# Patient Record
Sex: Female | Born: 1985 | Race: Black or African American | Hispanic: No | Marital: Single | Smoking: Never smoker
Health system: Southern US, Community
[De-identification: ages and names within clinical notes are randomized; demographics above are authoritative.]

## PROBLEM LIST (undated history)

## (undated) DIAGNOSIS — R569 Unspecified convulsions: Secondary | ICD-10-CM

## (undated) HISTORY — PX: SURGERY OF LIP: SUR1315

---
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
## 2009-02-13 ENCOUNTER — Emergency Department (HOSPITAL_COMMUNITY): Admission: EM | Admit: 2009-02-13 | Discharge: 2009-02-13 | Payer: Self-pay | Admitting: Emergency Medicine

## 2009-11-12 ENCOUNTER — Emergency Department (HOSPITAL_COMMUNITY): Admission: EM | Admit: 2009-11-12 | Discharge: 2009-11-12 | Payer: Self-pay | Admitting: Emergency Medicine

## 2009-11-14 ENCOUNTER — Emergency Department (HOSPITAL_COMMUNITY): Admission: EM | Admit: 2009-11-14 | Discharge: 2009-11-14 | Payer: Self-pay | Admitting: Emergency Medicine

## 2009-12-06 ENCOUNTER — Inpatient Hospital Stay (HOSPITAL_COMMUNITY)
Admission: AD | Admit: 2009-12-06 | Discharge: 2009-12-06 | Payer: Self-pay | Source: Home / Self Care | Admitting: Obstetrics & Gynecology

## 2010-01-03 ENCOUNTER — Emergency Department (HOSPITAL_COMMUNITY)
Admission: EM | Admit: 2010-01-03 | Discharge: 2010-01-03 | Payer: Self-pay | Source: Home / Self Care | Admitting: Emergency Medicine

## 2010-01-06 ENCOUNTER — Ambulatory Visit: Payer: Self-pay | Admitting: Obstetrics & Gynecology

## 2010-03-17 LAB — URINALYSIS, ROUTINE W REFLEX MICROSCOPIC
Bilirubin Urine: NEGATIVE
Glucose, UA: NEGATIVE mg/dL
Glucose, UA: NEGATIVE mg/dL
Ketones, ur: 15 mg/dL — AB
Ketones, ur: NEGATIVE mg/dL
Leukocytes, UA: NEGATIVE
Nitrite: NEGATIVE
Nitrite: NEGATIVE
Protein, ur: 100 mg/dL — AB
Protein, ur: NEGATIVE mg/dL
Specific Gravity, Urine: 1.026 (ref 1.005–1.030)
Specific Gravity, Urine: 1.02 (ref 1.005–1.030)
Urobilinogen, UA: 1 mg/dL (ref 0.0–1.0)
Urobilinogen, UA: 0.2 mg/dL (ref 0.0–1.0)
pH: 5.5 (ref 5.0–8.0)
pH: 6 (ref 5.0–8.0)

## 2010-03-17 LAB — URINE MICROSCOPIC-ADD ON

## 2010-03-17 LAB — DIFFERENTIAL
Basophils Absolute: 0 10*3/uL (ref 0.0–0.1)
Basophils Relative: 0 % (ref 0–1)
Eosinophils Absolute: 0 10*3/uL (ref 0.0–0.7)
Eosinophils Relative: 0 % (ref 0–5)
Lymphocytes Relative: 45 % (ref 12–46)
Lymphs Abs: 2.2 10*3/uL (ref 0.7–4.0)
Monocytes Absolute: 0.5 10*3/uL (ref 0.1–1.0)
Monocytes Relative: 11 % (ref 3–12)
Neutro Abs: 2.2 10*3/uL (ref 1.7–7.7)
Neutrophils Relative %: 44 % (ref 43–77)

## 2010-03-17 LAB — GC/CHLAMYDIA PROBE AMP, GENITAL
Chlamydia, DNA Probe: NEGATIVE
Chlamydia, DNA Probe: NEGATIVE
GC Probe Amp, Genital: NEGATIVE
GC Probe Amp, Genital: NEGATIVE

## 2010-03-17 LAB — PROLACTIN: Prolactin: 12.3 ng/mL

## 2010-03-17 LAB — BASIC METABOLIC PANEL
BUN: 7 mg/dL (ref 6–23)
CO2: 29 mEq/L (ref 19–32)
Calcium: 8.9 mg/dL (ref 8.4–10.5)
Chloride: 102 mEq/L (ref 96–112)
Creatinine, Ser: 0.75 mg/dL (ref 0.4–1.2)
GFR calc Af Amer: >60 mL/min (ref >60)
GFR calc non Af Amer: >60 mL/min (ref >60)
Glucose, Bld: 94 mg/dL (ref 70–99)
Potassium: 3 mEq/L — ABNORMAL LOW (ref 3.5–5.1)
Sodium: 136 mEq/L (ref 135–145)

## 2010-03-17 LAB — CBC
HCT: 27.2 % — ABNORMAL LOW (ref 36.0–46.0)
Hemoglobin: 8.1 g/dL — ABNORMAL LOW (ref 12.0–15.0)
MCH: 20.7 pg — ABNORMAL LOW (ref 26.0–34.0)
MCHC: 29.8 g/dL — ABNORMAL LOW (ref 30.0–36.0)
MCV: 69.4 fL — ABNORMAL LOW (ref 78.0–100.0)
Platelets: 322 10*3/uL (ref 150–400)
RBC: 3.92 MIL/uL (ref 3.87–5.11)
RDW: 19.7 % — ABNORMAL HIGH (ref 11.5–15.5)
WBC: 4.9 10*3/uL (ref 4.0–10.5)

## 2010-03-17 LAB — WET PREP, GENITAL
Trich, Wet Prep: NONE SEEN
Trich, Wet Prep: NONE SEEN
WBC, Wet Prep HPF POC: NONE SEEN
Yeast Wet Prep HPF POC: NONE SEEN
Yeast Wet Prep HPF POC: NONE SEEN

## 2010-03-17 LAB — TSH: TSH: 1.308 u[IU]/mL (ref 0.350–4.500)

## 2010-03-17 LAB — POCT PREGNANCY, URINE
Preg Test, Ur: NEGATIVE
Preg Test, Ur: NEGATIVE

## 2010-03-18 LAB — DIFFERENTIAL
Basophils Absolute: 0 10*3/uL (ref 0.0–0.1)
Basophils Relative: 0 % (ref 0–1)
Eosinophils Absolute: 0 10*3/uL (ref 0.0–0.7)
Eosinophils Relative: 0 % (ref 0–5)
Lymphocytes Relative: 18 % (ref 12–46)
Lymphs Abs: 1.2 10*3/uL (ref 0.7–4.0)
Monocytes Absolute: 0.5 10*3/uL (ref 0.1–1.0)
Monocytes Relative: 7 % (ref 3–12)
Neutro Abs: 5 10*3/uL (ref 1.7–7.7)
Neutrophils Relative %: 75 % (ref 43–77)

## 2010-03-18 LAB — CBC
HCT: 32.1 % — ABNORMAL LOW (ref 36.0–46.0)
Hemoglobin: 9.5 g/dL — ABNORMAL LOW (ref 12.0–15.0)
MCH: 20.3 pg — ABNORMAL LOW (ref 26.0–34.0)
MCHC: 29.6 g/dL — ABNORMAL LOW (ref 30.0–36.0)
MCV: 68.7 fL — ABNORMAL LOW (ref 78.0–100.0)
Platelets: 238 10*3/uL (ref 150–400)
RBC: 4.67 MIL/uL (ref 3.87–5.11)
RDW: 21.7 % — ABNORMAL HIGH (ref 11.5–15.5)
WBC: 6.7 10*3/uL (ref 4.0–10.5)

## 2010-03-18 LAB — COMPREHENSIVE METABOLIC PANEL
ALT: 21 U/L (ref 0–35)
AST: 27 U/L (ref 0–37)
Albumin: 3.6 g/dL (ref 3.5–5.2)
Alkaline Phosphatase: 62 U/L (ref 39–117)
BUN: 5 mg/dL — ABNORMAL LOW (ref 6–23)
CO2: 26 mEq/L (ref 19–32)
Calcium: 9.1 mg/dL (ref 8.4–10.5)
Chloride: 105 mEq/L (ref 96–112)
Creatinine, Ser: 0.71 mg/dL (ref 0.4–1.2)
GFR calc Af Amer: >60 mL/min (ref >60)
GFR calc non Af Amer: >60 mL/min (ref >60)
Glucose, Bld: 93 mg/dL (ref 70–99)
Potassium: 4.3 mEq/L (ref 3.5–5.1)
Sodium: 136 mEq/L (ref 135–145)
Total Bilirubin: 0.6 mg/dL (ref 0.3–1.2)
Total Protein: 8.2 g/dL (ref 6.0–8.3)

## 2010-03-18 LAB — URINALYSIS, ROUTINE W REFLEX MICROSCOPIC
Bilirubin Urine: NEGATIVE
Glucose, UA: NEGATIVE mg/dL
Hgb urine dipstick: NEGATIVE
Ketones, ur: NEGATIVE mg/dL
Nitrite: NEGATIVE
Protein, ur: NEGATIVE mg/dL
Specific Gravity, Urine: 1.017 (ref 1.005–1.030)
Urobilinogen, UA: 0.2 mg/dL (ref 0.0–1.0)
pH: 7.5 (ref 5.0–8.0)

## 2010-03-18 LAB — RAPID URINE DRUG SCREEN, HOSP PERFORMED
Amphetamines: NOT DETECTED
Barbiturates: NOT DETECTED
Benzodiazepines: NOT DETECTED
Cocaine: NOT DETECTED
Opiates: NOT DETECTED
Tetrahydrocannabinol: POSITIVE — AB

## 2010-03-18 LAB — PREGNANCY, URINE: Preg Test, Ur: NEGATIVE

## 2010-05-23 NOTE — Op Note (Signed)
NAMEWINDA, SUMMERALL              ACCOUNT NO.:  [id number]   MEDICAL RECORD NO.:  [id number]          PATIENT TYPE:  INP   LOCATION:  6121                         FACILITY:  [facility]   PHYSICIAN:  Prabhakar D. Pendse, M.D.DATE OF BIRTH:  1985-06-02   DATE OF PROCEDURE:  08/04/2004  DATE OF DISCHARGE:                                 OPERATIVE REPORT   PREOPERATIVE DIAGNOSIS:  Multiple gluteal abscesses.   POSTOPERATIVE DIAGNOSIS:  Multiple gluteal abscesses.   OPERATION PERFORMED AT:  1.  I&D of right gluteal abscess, 2 inches x 2 inches.  2.  I&D of left gluteal abscess, 1 inch x 1 inch.  3.  I&D of multiple microabscesses, total six of both buttocks.   SURGEON:  Prabhakar D. Levie Heritage, M.D.   ASSISTANT:  Nurse.   ANESTHESIA:  Nurse.   OPERATIVE PROCEDURE:  Under satisfactory general endotracheal anesthesia,  the patient in prone position, a large right gluteal abscess was opened by  sharp incision.  Abscess cavity entered. Purulent material obtained for  cultures.  Abscess cavity debrided, irrigated, packed with iodoform. Now the  left buttock abscess 1 inch x 1 inch was incised, drained, irrigated, packed  with Iodoform gauze. There were several tiny microabscesses and pustules,  which was opened by sharp incisions.  All the areas were irrigated and  dressed appropriately with occlusive dressing. Throughout the procedure, the  patient's vital signs remained stable. The patient withstood the procedure  well and was transferred to recovery room in satisfactory general condition.       PDP/MEDQ  D:  08/04/2004  T:  08/05/2004  Job:  [id number]   cc:   [facility]

## 2010-05-23 NOTE — Discharge Summary (Signed)
Nicole [name], ROSSA              ACCOUNT NO.:  [id number]   MEDICAL RECORD NO.:  [id number]          PATIENT TYPE:  INP   LOCATION:  6121                         FACILITY:  [facility]   PHYSICIAN:  Pediatrics Resident    DATE OF BIRTH:  27-Mar-1985   DATE OF ADMISSION:  08/04/2004  DATE OF DISCHARGE:  08/05/2004                                 DISCHARGE SUMMARY   HOSPITAL COURSE:  The patient is a 25 year old otherwise healthy female who  presents with multiple gluteal abscesses. Three weeks ago the patient had an  insect bite that was treated with five days of antibiotics, but did not  completely resolve. Three days ago the patient noted several pustules on  both buttocks and was referred to pediatric surgery. Patient underwent I&D  of two large gluteal abscesses and six micro abscesses on July 31st and was  started on IV clindamycin. The patient tolerated the procedure well and on  the day of discharge was tolerating p.o. food, was afebrile, and had good  urine output.   OPERATION/PROCEDURE:  1.  On August 04, 2004, I&D of multiple gluteal abscesses, two large and six      micro abscesses.  2.  On August 04, 2004, blood culture. No growth to date as of August 05, 2004.  3.  On August 04, 2004, wound culture significant for gram positive cocci and      clusters, sensitivity pending.   DIAGNOSIS:  Multiple gluteal abscesses.   MEDICATIONS:  Clindamycin 450 mg p.o. q.6h. times nine days.   DISCHARGE WEIGHT:  57 kg.   DISCHARGE CONDITION:  Stable.   DISCHARGE INSTRUCTIONS AND FOLLOW-UP:  Apply warm compress to area for 15  minutes, pat dry, apply Bactroban to entire second area, and cover with dry  dressing at least twice a day. Advised the patient to not remove packing,  only trim as necessary. Follow-up appointment scheduled with Dr. Levie Heritage on  Thursday, August 07, 2004, at 2 p.m.       PR/MEDQ  D:  08/05/2004  T:  08/06/2004  Job:  [id number]

## 2010-05-23 NOTE — Discharge Summary (Signed)
Nicole [name], BUFFIN              ACCOUNT NO.:  [id number]   MEDICAL RECORD NO.:  [id number]          PATIENT TYPE:  INP   LOCATION:  6121                         FACILITY:  [facility]   PHYSICIAN:  Pediatrics Resident    DATE OF BIRTH:  [birth year]   DATE OF ADMISSION:  08/04/2004  DATE OF DISCHARGE:  08/05/2004                                 DISCHARGE SUMMARY   HOSPITAL COURSE:  The patient is a 25 year old otherwise healthy female, who  presented with multiple gluteal abscesses.  Three weeks ago the patient had  an insect bite that was treated with five days of antibiotics but did not  completely resolve.  Three days ago the patient noticed several pustules on  both buttocks and was referred to pediatric surgery.  The patient underwent  I&D of two large gluteal abscesses and six microabscesses on July 31 and was  started on IV Clindamycin.  The patient tolerated the procedure well and on  the day of discharge was tolerating p.o., was afebrile, and had good urine  output.   OPERATIONS/PROCEDURES/TREATMENT:  1.  August 04, 2004:  I&D of multiple gluteal abscesses, two large and six      microabscesses.  2.  August 04, 2004:  Blood culture, no growth to date as of August 05, 2004.  3.  August 04, 2004:  Wound culture significant for gram positive cocci in      clusters.  Sensitivity is pending.   DIAGNOSIS:  Multiple gluteal abscesses.   MEDICATIONS:  Clindamycin 450 mg p.o. q.6 x9 days.   DISCHARGE WEIGHT:  57 kg.   CONDITION ON DISCHARGE:  Stable.   DISCHARGE INSTRUCTIONS/FOLLOW UP:  Apply warm compress to area for 15  minutes, pat dry.  Apply Bactroban to entire affected area and cover with  dry dressing at least twice a day.  Advised the patient not to remove  packing, only if necessary.  Follow-up appointment with Dr. Levie Heritage on  Thursday, August 07, 2004, at 2 p.m.       PR/MEDQ  D:  08/05/2004  T:  08/05/2004  Job:  [id number]

## 2010-06-16 ENCOUNTER — Inpatient Hospital Stay (HOSPITAL_COMMUNITY): Payer: Self-pay

## 2010-06-16 ENCOUNTER — Inpatient Hospital Stay (HOSPITAL_COMMUNITY)
Admission: AD | Admit: 2010-06-16 | Discharge: 2010-06-16 | Disposition: A | Discharge status: Home / Self Care | Payer: Self-pay | Source: Ambulatory Visit | Attending: Obstetrics & Gynecology | Admitting: Obstetrics & Gynecology

## 2010-06-16 DIAGNOSIS — O209 Hemorrhage in early pregnancy, unspecified: Secondary | ICD-10-CM

## 2010-06-16 LAB — URINALYSIS, ROUTINE W REFLEX MICROSCOPIC
Bilirubin Urine: NEGATIVE
Glucose, UA: NEGATIVE mg/dL
Ketones, ur: 40 mg/dL — AB
Protein, ur: NEGATIVE mg/dL

## 2010-06-16 LAB — WET PREP, GENITAL: Yeast Wet Prep HPF POC: NONE SEEN

## 2010-06-16 LAB — HCG, QUANTITATIVE, PREGNANCY: hCG, Beta Chain, Quant, S: 1693 m[IU]/mL — ABNORMAL HIGH (ref <5)

## 2010-06-16 LAB — CBC
HCT: 27.5 % — ABNORMAL LOW (ref 36.0–46.0)
MCHC: 28 g/dL — ABNORMAL LOW (ref 30.0–36.0)
RDW: 21.7 % — ABNORMAL HIGH (ref 11.5–15.5)

## 2010-06-16 LAB — URINE MICROSCOPIC-ADD ON

## 2010-06-16 LAB — ABO/RH: ABO/RH(D): AB POS

## 2010-06-19 ENCOUNTER — Inpatient Hospital Stay (HOSPITAL_COMMUNITY): Payer: Self-pay

## 2010-06-19 ENCOUNTER — Inpatient Hospital Stay (HOSPITAL_COMMUNITY)
Admission: AD | Admit: 2010-06-19 | Discharge: 2010-06-19 | Disposition: A | Discharge status: Home / Self Care | Payer: Self-pay | Source: Ambulatory Visit | Attending: Family Medicine | Admitting: Family Medicine

## 2010-06-19 DIAGNOSIS — O209 Hemorrhage in early pregnancy, unspecified: Secondary | ICD-10-CM | POA: Insufficient documentation

## 2010-06-21 ENCOUNTER — Ambulatory Visit (HOSPITAL_COMMUNITY): Payer: Self-pay

## 2011-06-18 ENCOUNTER — Encounter (HOSPITAL_COMMUNITY): Payer: Self-pay | Admitting: *Deleted

## 2011-06-18 ENCOUNTER — Emergency Department (HOSPITAL_COMMUNITY)
Admission: EM | Admit: 2011-06-18 | Discharge: 2011-06-18 | Disposition: A | Discharge status: Home / Self Care | Payer: Self-pay | Attending: Emergency Medicine | Admitting: Emergency Medicine

## 2011-06-18 DIAGNOSIS — R404 Transient alteration of awareness: Secondary | ICD-10-CM | POA: Insufficient documentation

## 2011-06-18 DIAGNOSIS — Z79899 Other long term (current) drug therapy: Secondary | ICD-10-CM | POA: Insufficient documentation

## 2011-06-18 DIAGNOSIS — G40909 Epilepsy, unspecified, not intractable, without status epilepticus: Secondary | ICD-10-CM | POA: Insufficient documentation

## 2011-06-18 HISTORY — DX: Unspecified convulsions: R56.9

## 2011-06-18 LAB — BASIC METABOLIC PANEL
BUN: 8 mg/dL (ref 6–23)
CO2: 22 mEq/L (ref 19–32)
Calcium: 9.1 mg/dL (ref 8.4–10.5)
Creatinine, Ser: 0.68 mg/dL (ref 0.50–1.10)
Glucose, Bld: 93 mg/dL (ref 70–99)

## 2011-06-18 LAB — DIFFERENTIAL
Basophils Absolute: 0 10*3/uL (ref 0.0–0.1)
Eosinophils Relative: 0 % (ref 0–5)
Lymphocytes Relative: 10 % — ABNORMAL LOW (ref 12–46)
Monocytes Absolute: 0.4 10*3/uL (ref 0.1–1.0)
Monocytes Relative: 5 % (ref 3–12)

## 2011-06-18 LAB — URINE MICROSCOPIC-ADD ON

## 2011-06-18 LAB — URINALYSIS, ROUTINE W REFLEX MICROSCOPIC
Bilirubin Urine: NEGATIVE
Glucose, UA: NEGATIVE mg/dL
Protein, ur: NEGATIVE mg/dL

## 2011-06-18 LAB — CBC
HCT: 34 % — ABNORMAL LOW (ref 36.0–46.0)
MCV: 72.8 fL — ABNORMAL LOW (ref 78.0–100.0)
RDW: 20.1 % — ABNORMAL HIGH (ref 11.5–15.5)
WBC: 8.7 10*3/uL (ref 4.0–10.5)

## 2011-06-18 MED ORDER — LEVETIRACETAM 500 MG PO TABS: 500.0000 mg | ORAL_TABLET | Freq: Two times a day (BID) | ORAL | Status: DC

## 2011-06-18 MED ORDER — LEVETIRACETAM 500 MG PO TABS
1000.0000 mg | ORAL_TABLET | Freq: Once | ORAL | Status: AC
  Administered 2011-06-18: 1000 mg via ORAL
  Filled 2011-06-18: qty 2

## 2011-06-18 NOTE — ED Notes (Signed)
Pt given discharge instructions/explained, in no distress, escorted to discharge window. 

## 2011-06-18 NOTE — Discharge Instructions (Signed)
Please read and follow all provided instructions.  Your diagnoses today include:  1. Seizure disorder     Tests performed today include:  Blood and urine tests - normal  Vital signs. See below for your results today.   Medications prescribed:   Keppra - anti-seizure medication  Home care instructions:  Follow any educational materials contained in this packet.  Follow-up instructions: Please follow-up with the neurologist listed. Tell them you were in the Emergency Department and told to follow-up.   If you do not have a primary care doctor -- see below for referral information.   Return instructions:   Please return to the Emergency Department if you experience worsening symptoms.   Please return if you have any other emergent concerns.  Additional Information:  Your vital signs today were: BP 105/56  Pulse 92  Temp 97.9 F (36.6 C) (Oral)  Resp 20  SpO2 100%  LMP 06/18/2011 If your blood pressure (BP) was elevated above 135/85 this visit, please have this repeated by your doctor within one month. -------------- No Primary Care Doctor Call [facility]  [phone] Other agencies that provide inexpensive medical care    [facility]  [phone]    [facility]  [phone]    [facility]  [phone]    [facility]  [phone]    [facility]  [phone]    [facility]  [phone] -------------- RESOURCE GUIDE:  Dental Problems  Patients with Medicaid: [facility] [phone] [address]                                            [phone] [address] Phone:  [phone]                                                   Phone:  [phone]  If unable to pay or uninsured, contact:  [facility] or [facility]. to become qualified for the adult dental clinic.  Chronic Pain Problems Contact [facility]  [phone] Patients need to be  referred by their primary care doctor.  Insufficient Money for Medicine Contact [employer]:  call "211" or [facility] [phone].  Psychological Services [facility]  [phone] [facility]  [phone] [facility] Mental Health   [phone] (emergency services [phone])  Substance Abuse Resources [facility]  [phone] [facility] [phone] [facility] [place] [phone] Floydene Flock [phone] Residential & Outpatient Substance Abuse Program  [phone]  Abuse/Neglect [facility] Child Abuse Hotline 410-745-1631 [facility] Child Abuse Hotline 714-712-1205 (After Hours)  Emergency Shelter [facility] [phone]  Maternity Homes [facility] [phone] [place] [facility] [phone]  [facility] Resources  [facility]     [employer]                          [facility] [address] [place]  [address]      [address]  Blondell Reveal Phone:  [phone]                                   Phone:  [phone]                 Phone:  [phone]  [facility] Phone:  [phone]  [facility] Child Abuse Hotline [phone] [phone] (After Hours)

## 2011-06-18 NOTE — ED Provider Notes (Signed)
History     CSN: [id number]  Arrival date & time 06/18/11  4098   First MD Initiated Contact with Patient 06/18/11 0744      Chief Complaint  Patient presents with  . Seizures    (Consider location/radiation/quality/duration/timing/severity/associated sxs/prior treatment) HPI Comments: Patient with history of 2 seizures within the last year-- presents with 3 seizures this morning at 4:15am with each seizure lasting around 2-3 minutes. History is obtained from patient's boyfriend who has witnessed all of the patient's seizures. Boyfriend reports that the patient "thrashes around" during each seizure until he rolls her onto her side. He also reports that she "tenses up" at the end of each seizure. Patient did bite her lip during one seizure this morning. Boyfriend reports that the patient is very confused after her seizures, and patient has no memory of the seizures occuring. Patient was brought in by EMS for her first seizure a year ago, and was referred to a neurologist, however due to costs did not follow up with neurology. Patient is not taking seizure medication, and denies recent drug or alcohol use. No fever, N/V/D.   Patient is a 26 y.o. female presenting with seizures. The history is provided by the patient.  Seizures  This is a new problem. The current episode started 3 to 5 hours ago. The problem has been resolved. There were 2 to 3 seizures. The most recent episode lasted 2 to 5 minutes. Pertinent negatives include no headaches, no visual disturbance, no neck stiffness, no sore throat, no chest pain, no cough, no nausea, no vomiting and no diarrhea. Characteristics include rhythmic jerking, loss of consciousness and bit tongue. Characteristics do not include bowel incontinence or bladder incontinence. The episode was witnessed. The seizures did not continue in the ED. There has been no fever.    Past Medical History  Diagnosis Date  . Seizures     Past Surgical History    Procedure Date  . Surgery of lip     History reviewed. No pertinent family history.  History  Substance Use Topics  . Smoking status: Never Smoker   . Smokeless tobacco: Never Used  . Alcohol Use: Yes    OB History    Grav Para Term Preterm Abortions TAB SAB Ect Mult Living                  Review of Systems  Constitutional: Negative for fever.  HENT: Negative for sore throat and rhinorrhea.   Eyes: Negative for redness and visual disturbance.  Respiratory: Negative for cough.   Cardiovascular: Negative for chest pain.  Gastrointestinal: Negative for nausea, vomiting, abdominal pain, diarrhea and bowel incontinence.  Genitourinary: Negative for bladder incontinence and dysuria.  Musculoskeletal: Negative for myalgias.  Skin: Negative for rash.  Neurological: Positive for seizures and loss of consciousness. Negative for headaches.    Allergies  Review of patient's allergies indicates no known allergies.  Home Medications  No current outpatient prescriptions on file.  BP 105/56  Pulse 92  Temp 97.9 F (36.6 C) (Oral)  Resp 20  SpO2 100%  LMP 06/18/2011  Physical Exam  Nursing note and vitals reviewed. Constitutional: She is oriented to person, place, and time. She appears well-developed and well-nourished.  HENT:  Head: Normocephalic and atraumatic.  Eyes: Conjunctivae are normal. Right eye exhibits no discharge. Left eye exhibits no discharge.  Neck: Normal range of motion. Neck supple.  Cardiovascular: Normal rate, regular rhythm and normal heart sounds.   Pulmonary/Chest: Effort normal and  breath sounds normal.  Abdominal: Soft. There is no tenderness.  Neurological: She is alert and oriented to person, place, and time. She has normal strength. No cranial nerve deficit or sensory deficit. She displays a negative Romberg sign. Coordination normal. GCS eye subscore is 4. GCS verbal subscore is 5. GCS motor subscore is 6.  Skin: Skin is warm and dry.   Psychiatric: She has a normal mood and affect.    ED Course  Procedures (including critical care time)  Labs Reviewed  CBC - Abnormal; Notable for the following:    Hemoglobin 10.4 (*)     HCT 34.0 (*)     MCV 72.8 (*)     MCH 22.3 (*)     RDW 20.1 (*)     All other components within normal limits  DIFFERENTIAL - Abnormal; Notable for the following:    Neutrophils Relative 85 (*)     Lymphocytes Relative 10 (*)     All other components within normal limits  URINALYSIS, ROUTINE W REFLEX MICROSCOPIC - Abnormal; Notable for the following:    APPearance TURBID (*)     Hgb urine dipstick LARGE (*)     Ketones, ur TRACE (*)     All other components within normal limits  URINE MICROSCOPIC-ADD ON - Abnormal; Notable for the following:    Crystals URIC ACID CRYSTALS (*)     All other components within normal limits  BASIC METABOLIC PANEL  POCT PREGNANCY, URINE   No results found.   1. Seizure disorder     9:09 AM Patient seen and examined. Work-up initiated.    Vital signs reviewed and are as follows: Filed Vitals:   06/18/11 0744  BP: 105/56  Pulse: 92  Temp: 97.9 F (36.6 C)  Resp: 20    Date: 06/18/2011  Rate: 93  Rhythm: normal sinus rhythm  QRS Axis: normal  Intervals: normal  ST/T Wave abnormalities: normal  Conduction Disutrbances:none  Narrative Interpretation:   Old EKG Reviewed: no changes from 11/14/09  Pt informed of lab results. I spoke with Dr. Roseanne Reno of neurology -- recommends starting Keppra 500mg  bid and neurology follow-up. Patient and family informed of plan.  They agree and are comfortable with plan. Exam is unchanged at time of discharge.   Patient urged to return if she has another seizure.   MDM  Patient with history of seizures, no neurology followup. CT scan done during initial presentation. Keppra started in the emergency department. Patient given neurology followup. She is to continue Keppra and followup. No head injury. No  neurological changes. No concern for alcohol or benzodiazepine withdrawal.     Renne Crigler, PA 06/18/11 1604

## 2011-06-18 NOTE — ED Notes (Signed)
Pt's boyfriend woke up this morning with pt having a seizure around 4:15, pt was drooling, sweaty, teeth clenched and shaking. Pt had 2 seizures back to back, lasting a couple minutes per boyfriend. After seizures pt walked to bathroom then went back to sleep, then about an hour later she had another seizure. Pt first started having seizures about a year ago, then had one a month ago, then the one's this morning. Pt is not on seizure medication.

## 2011-06-18 NOTE — ED Provider Notes (Signed)
Medical screening examination/treatment/procedure(s) were performed by non-physician practitioner and as supervising physician I was immediately available for consultation/collaboration.   Dayton Bailiff, MD 06/18/11 [phone]

## 2011-06-18 NOTE — ED Notes (Signed)
Pt. Is aware that we need a urine specimen, but is unable to go at this time. 

## 2011-09-17 ENCOUNTER — Emergency Department (HOSPITAL_COMMUNITY)
Admission: EM | Admit: 2011-09-17 | Discharge: 2011-09-18 | Disposition: A | Discharge status: Home / Self Care | Payer: Self-pay | Attending: Emergency Medicine | Admitting: Emergency Medicine

## 2011-09-17 ENCOUNTER — Encounter (HOSPITAL_COMMUNITY): Payer: Self-pay | Admitting: *Deleted

## 2011-09-17 DIAGNOSIS — R569 Unspecified convulsions: Secondary | ICD-10-CM | POA: Insufficient documentation

## 2011-09-17 LAB — GLUCOSE, CAPILLARY: Glucose-Capillary: 124 mg/dL — ABNORMAL HIGH (ref 70–99)

## 2011-09-17 MED ORDER — ONDANSETRON HCL 4 MG/2ML IJ SOLN
INTRAMUSCULAR | Status: AC
  Administered 2011-09-17: 23:00:00
  Filled 2011-09-17: qty 2

## 2011-09-17 NOTE — ED Notes (Signed)
ZOX:[id number]<AV> Expected date:09/17/11<BR> Expected time:10:35 PM<BR> Means of arrival:Ambulance<BR> Comments:<BR> seizure

## 2011-09-17 NOTE — ED Notes (Signed)
Per EMS report: Pt was a work at [employer] when pt experienced as what witnesses described as a "grand-mal seizure" lasting approximately in 45 seconds in duration. Upon EMS arrival, pt was in what appeared as an post-ictal state.  Pt able to open eyes w/ verbal stimulation but had no understanding of surroundings or event. EMS reports that Pt became somewhat combative. Pt began vomiting and administered 4mg  Zofran with noted improvement to wretching. IV: 20G R Hand, BP 100/50, HR:120, RR: 18, CBG: 141.  Pt has a hx of seizure and was prescribed depakote but is not compliant with medications.

## 2011-09-18 LAB — URINALYSIS, ROUTINE W REFLEX MICROSCOPIC
Bilirubin Urine: NEGATIVE
Glucose, UA: NEGATIVE mg/dL
Hgb urine dipstick: NEGATIVE
Ketones, ur: NEGATIVE mg/dL
Leukocytes, UA: NEGATIVE
Nitrite: NEGATIVE
Protein, ur: 30 mg/dL — AB
Specific Gravity, Urine: 1.02 (ref 1.005–1.030)
Urobilinogen, UA: 0.2 mg/dL (ref 0.0–1.0)
pH: 5.5 (ref 5.0–8.0)

## 2011-09-18 LAB — BASIC METABOLIC PANEL
BUN: 7 mg/dL (ref 6–23)
CO2: 25 mEq/L (ref 19–32)
Calcium: 9.2 mg/dL (ref 8.4–10.5)
Chloride: 100 mEq/L (ref 96–112)
Creatinine, Ser: 0.69 mg/dL (ref 0.50–1.10)
GFR calc Af Amer: >90 mL/min (ref >90)
GFR calc non Af Amer: >90 mL/min (ref >90)
Glucose, Bld: 112 mg/dL — ABNORMAL HIGH (ref 70–99)
Potassium: 3.7 mEq/L (ref 3.5–5.1)
Sodium: 133 mEq/L — ABNORMAL LOW (ref 135–145)

## 2011-09-18 LAB — CBC
HCT: 30.7 % — ABNORMAL LOW (ref 36.0–46.0)
Hemoglobin: 9.6 g/dL — ABNORMAL LOW (ref 12.0–15.0)
MCH: 22.5 pg — ABNORMAL LOW (ref 26.0–34.0)
MCHC: 31.3 g/dL (ref 30.0–36.0)
MCV: 71.9 fL — ABNORMAL LOW (ref 78.0–100.0)
Platelets: 262 10*3/uL (ref 150–400)
RBC: 4.27 MIL/uL (ref 3.87–5.11)
RDW: 20.8 % — ABNORMAL HIGH (ref 11.5–15.5)
WBC: 6.1 10*3/uL (ref 4.0–10.5)

## 2011-09-18 LAB — URINE MICROSCOPIC-ADD ON

## 2011-09-18 LAB — VALPROIC ACID LEVEL: Valproic Acid Lvl: <10 ug/mL — ABNORMAL LOW (ref 50.0–100.0)

## 2011-09-18 MED ORDER — LEVETIRACETAM 500 MG PO TABS
500.0000 mg | ORAL_TABLET | Freq: Once | ORAL | Status: AC
  Administered 2011-09-18: 500 mg via ORAL
  Filled 2011-09-18 (×2): qty 1

## 2011-09-18 MED ORDER — IBUPROFEN 200 MG PO TABS
400.0000 mg | ORAL_TABLET | Freq: Once | ORAL | Status: AC
  Administered 2011-09-18: 400 mg via ORAL
  Filled 2011-09-18: qty 2

## 2011-09-18 MED ORDER — LEVETIRACETAM 500 MG PO TABS: 500.0000 mg | ORAL_TABLET | Freq: Two times a day (BID) | ORAL | Status: DC

## 2011-09-18 NOTE — ED Provider Notes (Signed)
History    25yf with seizure. Happened while at work just pta. Generalized tonic clonic activity reports. Pt amnestic to events. Apparently post-ictal for EMS. Currently c/o HA and feeling tired. Hx of seizure. Previously on meds but not compliant. Pt not sure of what specifically. Felt fine earlier in day. No fever or chills. No trauma. Denies ingestion. No urinary complaints.  CSN: [id number]  Arrival date & time 09/17/11  2246   First MD Initiated Contact with Patient 09/18/11 0020      No chief complaint on file.   (Consider location/radiation/quality/duration/timing/severity/associated sxs/prior treatment) HPI  Past Medical History  Diagnosis Date  . Seizures     Past Surgical History  Procedure Date  . Surgery of lip   . Surgery of lip     History reviewed. No pertinent family history.  History  Substance Use Topics  . Smoking status: Never Smoker   . Smokeless tobacco: Never Used  . Alcohol Use: Yes    OB History    Grav Para Term Preterm Abortions TAB SAB Ect Mult Living                  Review of Systems   Review of symptoms negative unless otherwise noted in HPI.   Allergies  Review of patient's allergies indicates no known allergies.  Home Medications   Current Outpatient Rx  Name Route Sig Dispense Refill  . LEVETIRACETAM 500 MG PO TABS Oral Take 1 tablet (500 mg total) by mouth every 12 (twelve) hours. 60 tablet 0    BP 128/67  Pulse 84  Temp 97.6 F (36.4 C)  Resp 18  SpO2 100%  LMP 09/06/2011  Physical Exam  Nursing note and vitals reviewed. Constitutional: She is oriented to person, place, and time. She appears well-developed and well-nourished. No distress.  HENT:  Head: Normocephalic and atraumatic.  Eyes: Conjunctivae normal are normal. Right eye exhibits no discharge. Left eye exhibits no discharge.  Neck: Neck supple.  Cardiovascular: Normal rate, regular rhythm and normal heart sounds.  Exam reveals no gallop and no  friction rub.   No murmur heard. Pulmonary/Chest: Effort normal and breath sounds normal. No respiratory distress.  Abdominal: Soft. She exhibits no distension. There is no tenderness.  Musculoskeletal: She exhibits no edema and no tenderness.  Neurological: She is alert and oriented to person, place, and time. No cranial nerve deficit. She exhibits normal muscle tone. Coordination normal.       Good finger to nose b/l  Skin: Skin is warm and dry.  Psychiatric: She has a normal mood and affect. Her behavior is normal. Thought content normal.    ED Course  Procedures (including critical care time)  Labs Reviewed  VALPROIC ACID LEVEL - Abnormal; Notable for the following:    Valproic Acid Lvl <10.0 (*)     All other components within normal limits  GLUCOSE, CAPILLARY - Abnormal; Notable for the following:    Glucose-Capillary 124 (*)     All other components within normal limits  URINALYSIS, ROUTINE W REFLEX MICROSCOPIC - Abnormal; Notable for the following:    Protein, ur 30 (*)     All other components within normal limits  BASIC METABOLIC PANEL - Abnormal; Notable for the following:    Sodium 133 (*)     Glucose, Bld 112 (*)     All other components within normal limits  CBC - Abnormal; Notable for the following:    Hemoglobin 9.6 (*)     HCT  30.7 (*)     MCV 71.9 (*)     MCH 22.5 (*)     RDW 20.8 (*)     All other components within normal limits  URINE MICROSCOPIC-ADD ON - Abnormal; Notable for the following:    Bacteria, UA MANY (*)     All other components within normal limits  POCT PREGNANCY, URINE  LAB REPORT - SCANNED   No results found.   1. Seizure       MDM  26 year old female with seizure. Patient has a history of same. Patient is supposed to take antiepileptics, but she's not sure what exactly. Per review of prior records neurology recommended 500 mg of Keppra twice a day on last ER visit in June. This is again initiated in the ED. Script provided. Anemia  noted, but has been lower previously. Minimal hypoNa. Doubt reason for seizure. No clinical signs of infectious process.  Feel safe for DC. Seizure precautions discussed. Outpt fu.       Raeford Razor, MD 09/21/11 [phone]

## 2012-08-28 IMAGING — US US OB COMP LESS 14 WK
1 series · 14 of 28 positions shown · non-contrast
Comparison: none

[Series 1: us ob comp less 14 wks · 14 of 55 slices shown]
[im 3/55]
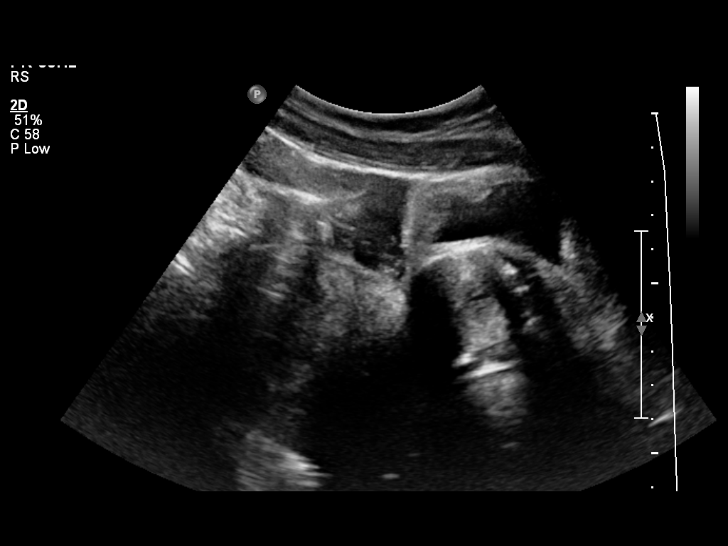
[im 7/55]
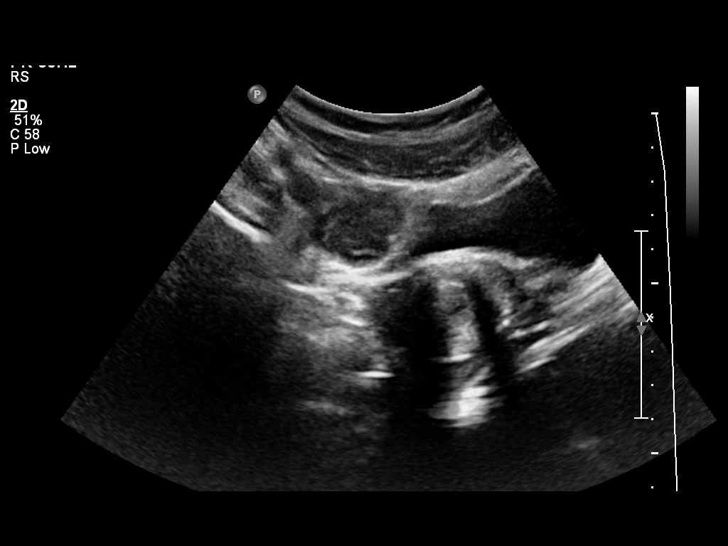
[im 11/55]
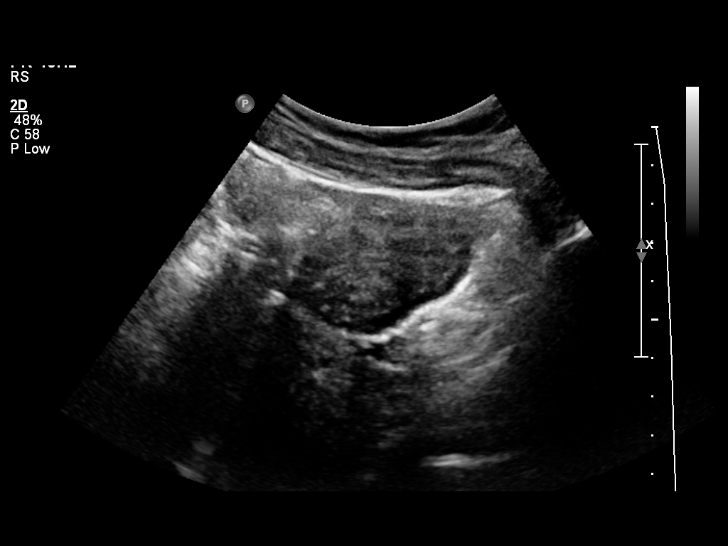
[im 15/55]
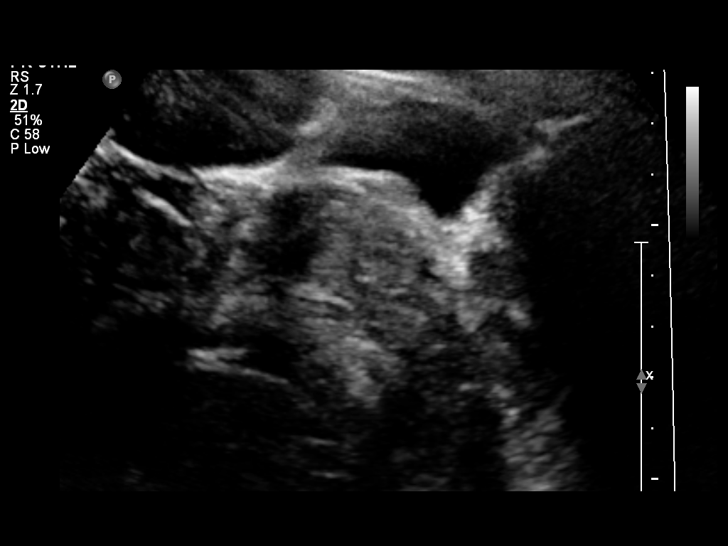
[im 19/55]
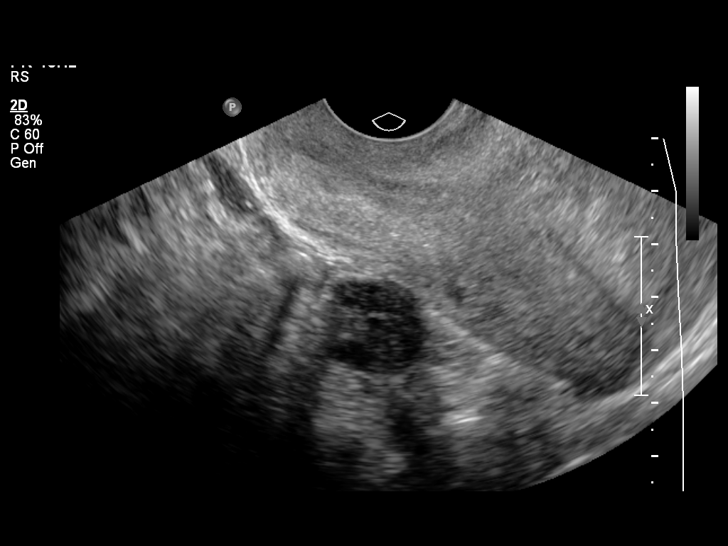
[im 23/55]
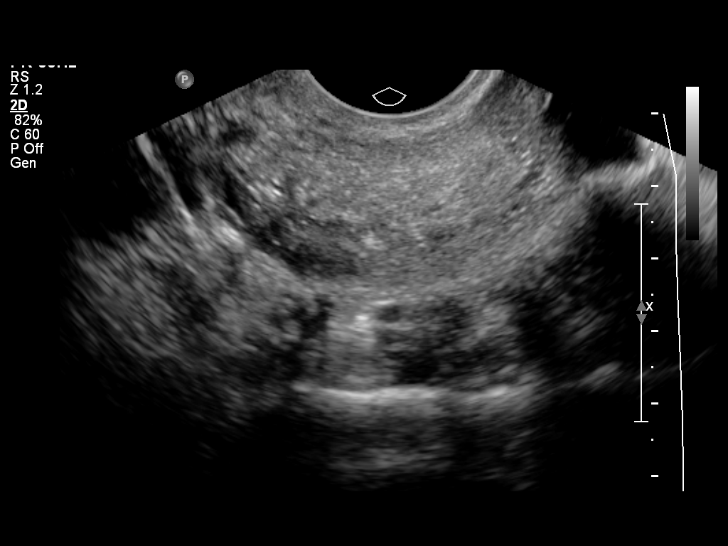
[im 27/55]
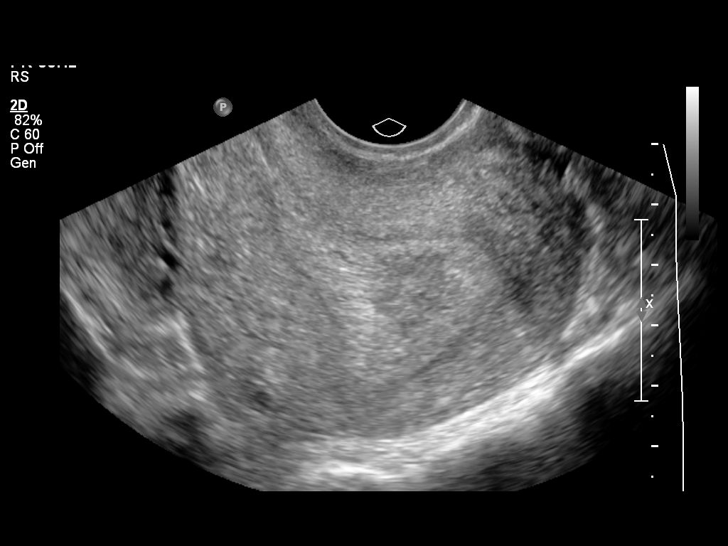
[im 31/55]
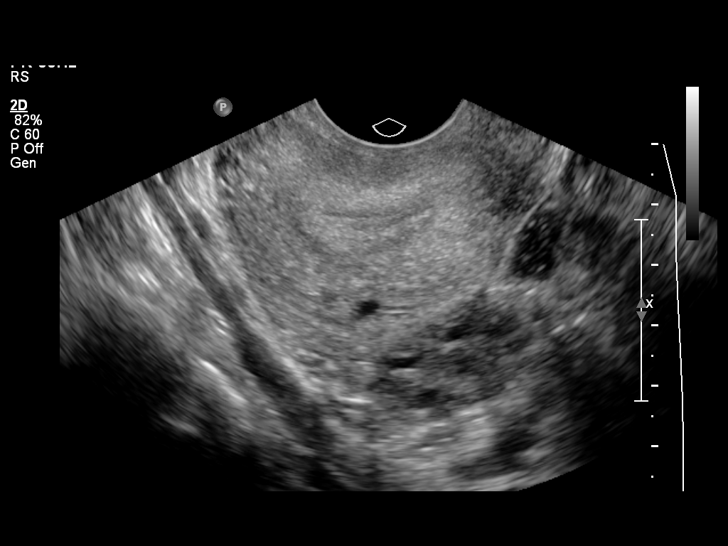
[im 35/55]
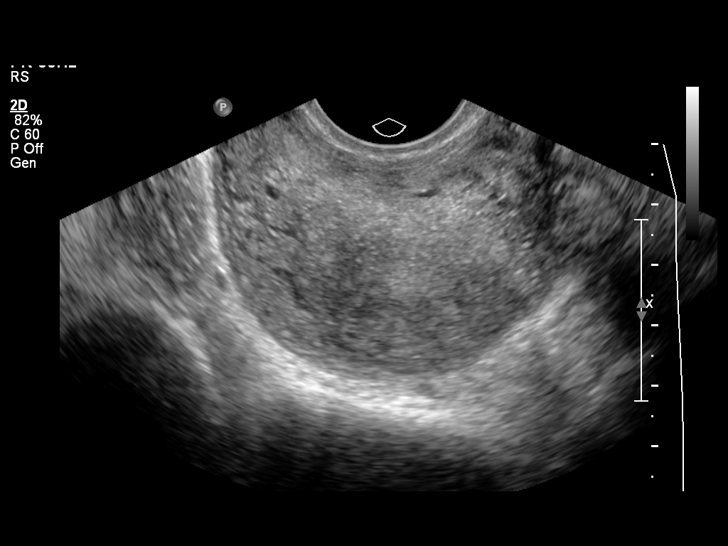
[im 39/55]
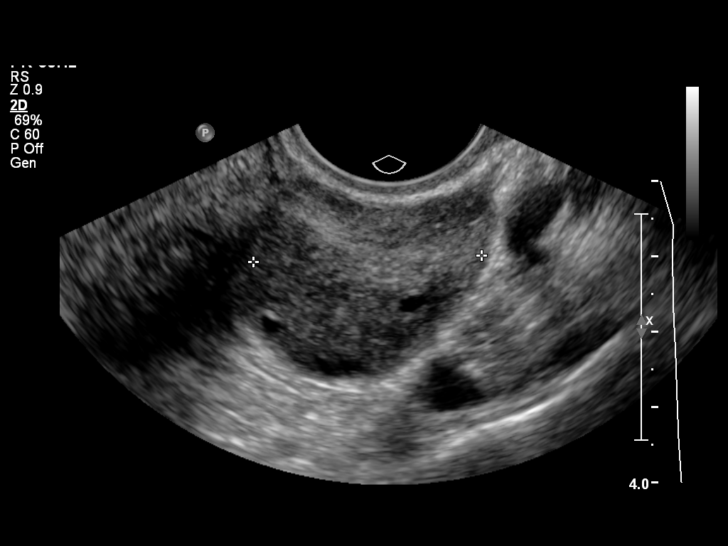
[im 43/55]
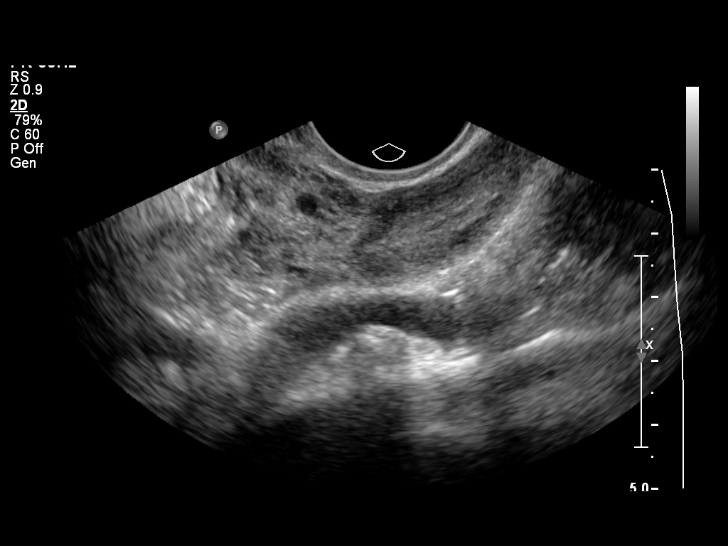
[im 47/55]
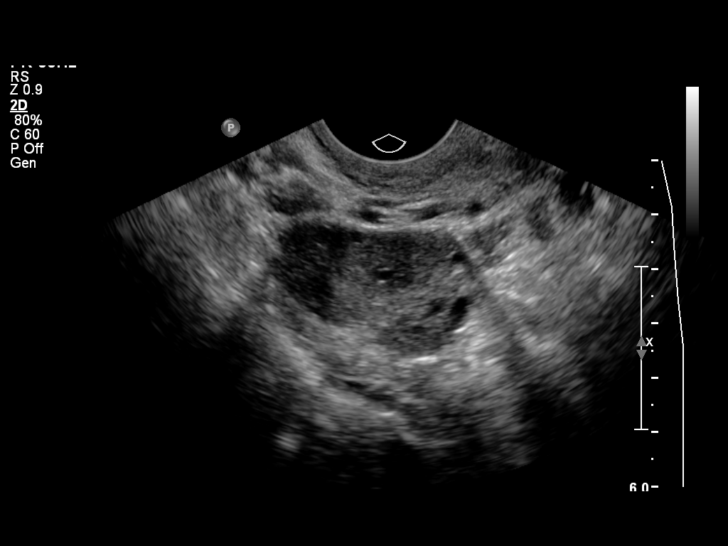
[im 51/55]
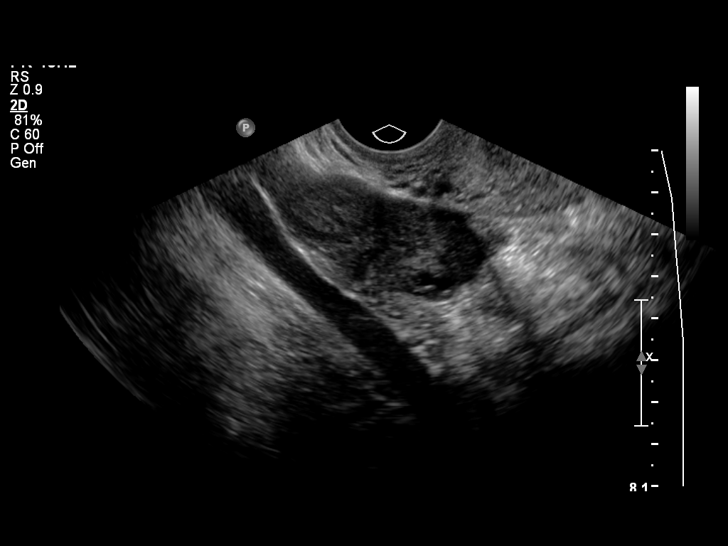
[im 55/55]
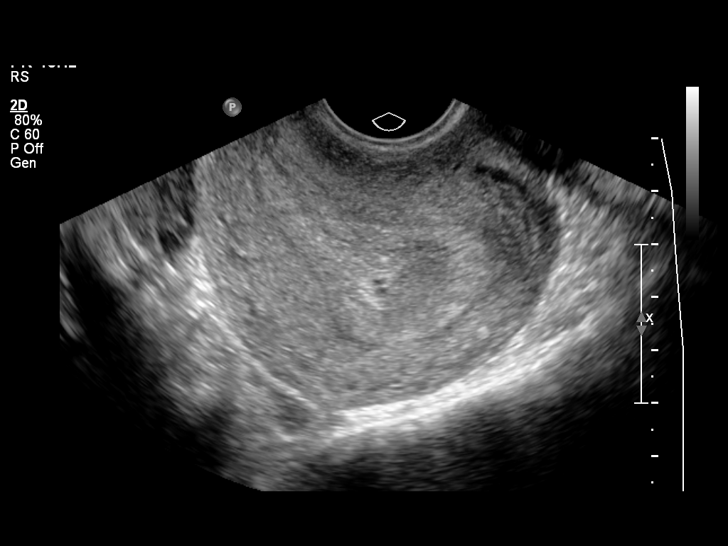

[14 of 28 positions shown; findings below may reference images not displayed]

OBSTETRICS REPORT
                      (Signed Final 06/16/2010 [DATE])

                 MAU/Triage
                 91_E
Procedures

 US OB COMP LESS 14 WKS                                76801.0
 US OB TRANSVAGINAL                                    76817.0
Indications

 Vaginal bleeding, unknown etiology
 Poor obstetric history-Recurrent (habitual) abortion
 (2 consecutive sab's)
Fetal Evaluation

 Gest. Sac:         ? early IUGS vs.
                    pseudosac
 Yolk Sac:          Not visualized
 Fetal Pole:        Not visualized
 Cardiac Activity:  No embryo visualized
Cervix Uterus Adnexa

 Cervix:       Closed.
 Uterus:       No abnormality visualized.
 Cul De Sac:   No free fluid seen.

 Left Ovary:    Within normal limits.
 Right Ovary:   Within normal limits.
 Adnexa:     No abnormality visualized.
Impression

 Suspect tiny 5wk IUGS barely visible transvaginally.
 No adnexal mass or free fluid identified.
Recommendations

 Close follow-up of quantitative B HCG levels, and followup by
 US in 10 days.

## 2012-09-12 LAB — OB RESULTS CONSOLE ABO/RH: RH Type: POSITIVE

## 2012-09-12 LAB — OB RESULTS CONSOLE HEPATITIS B SURFACE ANTIGEN: Hepatitis B Surface Ag: NEGATIVE

## 2012-09-12 LAB — OB RESULTS CONSOLE ANTIBODY SCREEN: ANTIBODY SCREEN: NEGATIVE

## 2012-09-12 LAB — OB RESULTS CONSOLE HIV ANTIBODY (ROUTINE TESTING): HIV: NONREACTIVE

## 2012-09-12 LAB — OB RESULTS CONSOLE RUBELLA ANTIBODY, IGM: RUBELLA: IMMUNE

## 2012-09-12 LAB — OB RESULTS CONSOLE RPR: RPR: NONREACTIVE

## 2012-09-12 LAB — OB RESULTS CONSOLE GC/CHLAMYDIA
Chlamydia: NEGATIVE
Gonorrhea: NEGATIVE

## 2013-01-17 LAB — OB RESULTS CONSOLE GBS: GBS: NEGATIVE

## 2013-01-22 LAB — OB RESULTS CONSOLE GBS: GBS: POSITIVE

## 2013-02-05 ENCOUNTER — Inpatient Hospital Stay (HOSPITAL_COMMUNITY): Payer: Medicaid Other | Admitting: Anesthesiology

## 2013-02-05 ENCOUNTER — Encounter (HOSPITAL_COMMUNITY): Payer: Medicaid Other | Admitting: Anesthesiology

## 2013-02-05 ENCOUNTER — Inpatient Hospital Stay (HOSPITAL_COMMUNITY)
Admission: AD | Admit: 2013-02-05 | Discharge: 2013-02-08 | DRG: 765 | Disposition: A | Discharge status: Home / Self Care | Payer: Medicaid Other | Source: Ambulatory Visit | Attending: Obstetrics and Gynecology | Admitting: Obstetrics and Gynecology

## 2013-02-05 ENCOUNTER — Encounter (HOSPITAL_COMMUNITY): Payer: Self-pay | Admitting: *Deleted

## 2013-02-05 DIAGNOSIS — E669 Obesity, unspecified: Secondary | ICD-10-CM | POA: Diagnosis present

## 2013-02-05 DIAGNOSIS — R569 Unspecified convulsions: Secondary | ICD-10-CM

## 2013-02-05 DIAGNOSIS — O99820 Streptococcus B carrier state complicating pregnancy: Secondary | ICD-10-CM

## 2013-02-05 DIAGNOSIS — O99354 Diseases of the nervous system complicating childbirth: Secondary | ICD-10-CM | POA: Diagnosis present

## 2013-02-05 DIAGNOSIS — O0930 Supervision of pregnancy with insufficient antenatal care, unspecified trimester: Secondary | ICD-10-CM

## 2013-02-05 DIAGNOSIS — O358XX0 Maternal care for other (suspected) fetal abnormality and damage, not applicable or unspecified: Secondary | ICD-10-CM | POA: Diagnosis present

## 2013-02-05 DIAGNOSIS — Z22330 Carrier of Group B streptococcus: Secondary | ICD-10-CM

## 2013-02-05 DIAGNOSIS — G40909 Epilepsy, unspecified, not intractable, without status epilepticus: Secondary | ICD-10-CM | POA: Diagnosis present

## 2013-02-05 DIAGNOSIS — O99214 Obesity complicating childbirth: Secondary | ICD-10-CM

## 2013-02-05 DIAGNOSIS — O9989 Other specified diseases and conditions complicating pregnancy, childbirth and the puerperium: Secondary | ICD-10-CM

## 2013-02-05 DIAGNOSIS — O99892 Other specified diseases and conditions complicating childbirth: Secondary | ICD-10-CM | POA: Diagnosis present

## 2013-02-05 DIAGNOSIS — IMO0002 Reserved for concepts with insufficient information to code with codable children: Secondary | ICD-10-CM

## 2013-02-05 DIAGNOSIS — Z8614 Personal history of Methicillin resistant Staphylococcus aureus infection: Secondary | ICD-10-CM

## 2013-02-05 DIAGNOSIS — Z22322 Carrier or suspected carrier of Methicillin resistant Staphylococcus aureus: Secondary | ICD-10-CM

## 2013-02-05 LAB — CBC
HEMATOCRIT: 37.5 % (ref 36.0–46.0)
Hemoglobin: 12.8 g/dL (ref 12.0–15.0)
MCH: 30.9 pg (ref 26.0–34.0)
MCHC: 34.1 g/dL (ref 30.0–36.0)
MCV: 90.6 fL (ref 78.0–100.0)
Platelets: 195 10*3/uL (ref 150–400)
RBC: 4.14 MIL/uL (ref 3.87–5.11)
RDW: 15.9 % — AB (ref 11.5–15.5)
WBC: 8.6 10*3/uL (ref 4.0–10.5)

## 2013-02-05 LAB — MRSA PCR SCREENING: MRSA BY PCR: NEGATIVE

## 2013-02-05 LAB — TYPE AND SCREEN
ABO/RH(D): AB POS
Antibody Screen: NEGATIVE

## 2013-02-05 LAB — AMNISURE RUPTURE OF MEMBRANE (ROM) NOT AT ARMC: Amnisure ROM: POSITIVE

## 2013-02-05 MED ORDER — EPHEDRINE 5 MG/ML INJ
10.0000 mg | INTRAVENOUS | Status: DC | PRN
  Filled 2013-02-05: qty 4

## 2013-02-05 MED ORDER — LACTATED RINGERS IV SOLN
INTRAVENOUS | Status: DC
  Administered 2013-02-05: 17:00:00 via INTRAVENOUS

## 2013-02-05 MED ORDER — FLEET ENEMA 7-19 GM/118ML RE ENEM: 1.0000 | ENEMA | RECTAL | Status: DC | PRN

## 2013-02-05 MED ORDER — LACTATED RINGERS IV SOLN
500.0000 mL | Freq: Once | INTRAVENOUS | Status: AC
  Administered 2013-02-05: 500 mL via INTRAVENOUS

## 2013-02-05 MED ORDER — BUTORPHANOL TARTRATE 1 MG/ML IJ SOLN
INTRAMUSCULAR | Status: AC
  Filled 2013-02-05: qty 1

## 2013-02-05 MED ORDER — LIDOCAINE HCL (PF) 1 % IJ SOLN
INTRAMUSCULAR | Status: DC | PRN
  Administered 2013-02-05 (×2): 4 mL

## 2013-02-05 MED ORDER — LACTATED RINGERS IV SOLN: INTRAVENOUS | Status: DC

## 2013-02-05 MED ORDER — FENTANYL 2.5 MCG/ML BUPIVACAINE 1/10 % EPIDURAL INFUSION (WH - ANES)
INTRAMUSCULAR | Status: DC | PRN
  Administered 2013-02-05: 14 mL/h via EPIDURAL

## 2013-02-05 MED ORDER — EPHEDRINE 5 MG/ML INJ: 10.0000 mg | INTRAVENOUS | Status: DC | PRN

## 2013-02-05 MED ORDER — CITRIC ACID-SODIUM CITRATE 334-500 MG/5ML PO SOLN
30.0000 mL | ORAL | Status: DC | PRN
  Administered 2013-02-06: 30 mL via ORAL
  Filled 2013-02-05: qty 15

## 2013-02-05 MED ORDER — DIPHENHYDRAMINE HCL 50 MG/ML IJ SOLN: 12.5000 mg | INTRAMUSCULAR | Status: DC | PRN

## 2013-02-05 MED ORDER — OXYTOCIN BOLUS FROM INFUSION: 500.0000 mL | INTRAVENOUS | Status: DC

## 2013-02-05 MED ORDER — PHENYLEPHRINE 40 MCG/ML (10ML) SYRINGE FOR IV PUSH (FOR BLOOD PRESSURE SUPPORT): 80.0000 ug | PREFILLED_SYRINGE | INTRAVENOUS | Status: DC | PRN

## 2013-02-05 MED ORDER — ACETAMINOPHEN 325 MG PO TABS: 650.0000 mg | ORAL_TABLET | ORAL | Status: DC | PRN

## 2013-02-05 MED ORDER — LIDOCAINE HCL (PF) 1 % IJ SOLN
30.0000 mL | INTRAMUSCULAR | Status: DC | PRN
  Filled 2013-02-05: qty 30

## 2013-02-05 MED ORDER — IBUPROFEN 600 MG PO TABS: 600.0000 mg | ORAL_TABLET | Freq: Four times a day (QID) | ORAL | Status: DC | PRN

## 2013-02-05 MED ORDER — ONDANSETRON HCL 4 MG/2ML IJ SOLN: 4.0000 mg | Freq: Four times a day (QID) | INTRAMUSCULAR | Status: DC | PRN

## 2013-02-05 MED ORDER — LACTATED RINGERS IV SOLN
500.0000 mL | INTRAVENOUS | Status: DC | PRN
  Administered 2013-02-05: 500 mL via INTRAVENOUS

## 2013-02-05 MED ORDER — OXYCODONE-ACETAMINOPHEN 5-325 MG PO TABS: 1.0000 | ORAL_TABLET | ORAL | Status: DC | PRN

## 2013-02-05 MED ORDER — PENICILLIN G POTASSIUM 5000000 UNITS IJ SOLR
5.0000 10*6.[IU] | Freq: Once | INTRAVENOUS | Status: AC
  Administered 2013-02-05: 5 10*6.[IU] via INTRAVENOUS
  Filled 2013-02-05: qty 5

## 2013-02-05 MED ORDER — FENTANYL 2.5 MCG/ML BUPIVACAINE 1/10 % EPIDURAL INFUSION (WH - ANES)
14.0000 mL/h | INTRAMUSCULAR | Status: DC | PRN
  Filled 2013-02-05: qty 125

## 2013-02-05 MED ORDER — PENICILLIN G POTASSIUM 5000000 UNITS IJ SOLR
2.5000 10*6.[IU] | INTRAVENOUS | Status: DC
  Administered 2013-02-05: 2.5 10*6.[IU] via INTRAVENOUS
  Filled 2013-02-05 (×6): qty 2.5

## 2013-02-05 MED ORDER — PHENYLEPHRINE 40 MCG/ML (10ML) SYRINGE FOR IV PUSH (FOR BLOOD PRESSURE SUPPORT)
80.0000 ug | PREFILLED_SYRINGE | INTRAVENOUS | Status: DC | PRN
  Filled 2013-02-05: qty 10

## 2013-02-05 MED ORDER — BUTORPHANOL TARTRATE 1 MG/ML IJ SOLN
1.0000 mg | INTRAMUSCULAR | Status: DC | PRN
  Administered 2013-02-05: 1 mg via INTRAVENOUS

## 2013-02-05 MED ORDER — OXYTOCIN 40 UNITS IN LACTATED RINGERS INFUSION - SIMPLE MED
62.5000 mL/h | INTRAVENOUS | Status: DC
  Filled 2013-02-05: qty 1000

## 2013-02-05 NOTE — H&P (Signed)
Nicole [name] is a 28 y.o. female, G2P0010 at 76 weeks, presenting for admission due to leaking of fluid last 2-3 hours, UCs q 5 min.  Amnisure + in MAU, cervix loose 1 cm, 100%, vtx, -1, forewaters palpated.  Patient Active Problem List   Diagnosis Date Noted  . Seizure--2013 x 1, no etiology 02/05/2013  . MRSA (methicillin resistant staph aureus) culture positive--per [facility] records 02/05/2013  . Fetal pyelectasis--bilateral 02/05/2013  . Fetal cardiac echogenic focus--LV, resolved during pregnancy 02/05/2013  . Late prenatal care 02/05/2013  . GBS (group B Streptococcus carrier), +RV culture, currently pregnant 02/05/2013    History of present pregnancy: Patient entered care at 17 5/7 weeks.   EDC of 02/19/13 was established by LMP and in agreement with [place] at 17 weeks..   Anatomy scan: 68 5/7weeks, with limited anatomy evaluation and an anterior placenta, female, cervix closed. Additional [place] evaluations:   19 3/7 weeks-- bilateral pyelectasis, LVEIF, completion of rest of anatomy WNL.  Cervix closed. 31 3/7 weeks--EFW 1850 gm, 52%ile, AFI 60%ile, left pyelectasis resolved, right increased to 0.70.  Significant prenatal events:  LVEIF and bilateral pyelectasis noted on anatomy scan, with normal Quad screen.  Declined flu vaccine and TDaP. LVEIF resolved on 27 week [place]. 33 2/7 weeks--Normal AFI, mild pyelectasis bilaterally, cervix closed. Last evaluation:  02/03/13, cervix 1 cm, 50%, vtx, -2.  OB History   Grav Para Term Preterm Abortions TAB SAB Ect Mult Living   2             2012--SAB, 5 weeks.  Past Medical History  Diagnosis Date  . Seizures     havnt had seizure in two years  MRSA hx noted on infection header of chart--patient reports remote hx of "staph infection" several years ago.   Past Surgical History  Procedure Laterality Date  . Surgery of lip    . Surgery of lip     Family History:MGM HTN, DM  Social History:  reports that she has never smoked. She has never  used smokeless tobacco. She reports that she drinks alcohol. She reports that she does not use illicit drugs.  FOB is present and involved.  Patient has some college, is currently unemployed.   Prenatal Transfer Tool  Maternal Diabetes: No Genetic Screening: Normal Quad screen Maternal Ultrasounds/Referrals: Abnormal:  Findings:   Fetal renal pyelectasis bilaterally (LVEIF resolved during pregnancy) Fetal Ultrasounds or other Referrals:  None Maternal Substance Abuse:  None during pregnancy Significant Maternal Medications:  None Significant Maternal Lab Results: Lab values include: Group B Strep positive, Other: MRSA Positive (remote hx)--retest pending from admission  ROS:  Contractions, leaking fluid, +FM  No Known Allergies   Dilation: 1 Effacement (%): 90 Station: -1 Exam by:: VEmilee Hero CNM Blood pressure 139/88, pulse 82, temperature 97.8 F (36.6 C), temperature source Oral, resp. rate 18, height 5\' 2"  (1.575 m), weight 216 lb 12.8 oz (98.34 kg).  Chest clear Heart RRR without murmur Abd gravid, NT, FH 39 cm Pelvic: Loose 1 cm, 1005, vtx, -1, forewaters noted, leaking small amount clear fluid Ext: WNL  FHR: Category 1 UCs:  q 5 min, moderate  Prenatal labs: ABO, Rh:  AB + Antibody:  Neg Rubella:   Immune RPR:   NR HBsAg:   Neg HIV:   NR GBS:  Positive Sickle cell/Hgb electrophoresis:  AA Pap:  WNL 09/16/12, with BV GC:  Negative 9/14 Chlamydia:  Negative 9/14 Genetic screenings:  Negative Quad screen Glucola:  Elevated 1 hour  GTT, single abnormal value on 3 hour GTT Hgb 13.3 at NOB, 12.9 at 28 weeks.   Assessment/Plan: IUP at 38 weeks SROM GBS positive MRSA hx Fetal pyelectasis and LVEIF Desires pain medication  Plan: Admit to Birthing Suite per consult with Dr. Stefano GaulStringer Routine [facility] orders GBS prophylaxis with PCN G per standard dosing MRSA precautions--will test on admission. Pain medication prn.  Akiko Schexnider, VICKICNM, [place] 02/05/2013, 5:03  PM

## 2013-02-05 NOTE — Anesthesia Preprocedure Evaluation (Addendum)
Anesthesia Evaluation  Patient identified by MRN, date of birth, ID band Patient awake    Reviewed: Allergy & Precautions, H&P , NPO status , Patient's Chart, lab work & pertinent test results  Airway Mallampati: II TM Distance: >3 FB Neck ROM: Full    Dental  (+) Dental Advisory Given   Pulmonary neg pulmonary ROS,  breath sounds clear to auscultation        Cardiovascular negative cardio ROS  Rhythm:Regular Rate:Normal     Neuro/Psych Seizures -,  negative psych ROS   GI/Hepatic negative GI ROS, Neg liver ROS,   Endo/Other  Morbid obesity  Renal/GU negative Renal ROS     Musculoskeletal negative musculoskeletal ROS (+)   Abdominal (+) + obese,   Peds  Hematology negative hematology ROS (+)   Anesthesia Other Findings   Reproductive/Obstetrics (+) Pregnancy                           Anesthesia Physical Anesthesia Plan  ASA: III  Anesthesia Plan: Epidural   Post-op Pain Management:    Induction:   Airway Management Planned:   Additional Equipment:   Intra-op Plan:   Post-operative Plan:   Informed Consent: I have reviewed the patients History and Physical, chart, labs and discussed the procedure including the risks, benefits and alternatives for the proposed anesthesia with the patient or authorized representative who has indicated his/her understanding and acceptance.   Dental advisory given  Plan Discussed with: CRNA  Anesthesia Plan Comments:        Anesthesia Quick Evaluation

## 2013-02-05 NOTE — Anesthesia Procedure Notes (Signed)
Epidural Patient location during procedure: OB Start time: 02/05/2013 7:58 PM End time: 02/05/2013 8:08 PM  Staffing Anesthesiologist: Lewie LoronGERMEROTH, Isiah Scheel R Performed by: anesthesiologist   Preanesthetic Checklist Completed: patient identified, pre-op evaluation, timeout performed, IV checked, risks and benefits discussed and monitors and equipment checked  Epidural Patient position: sitting Prep: site prepped and draped and DuraPrep Patient monitoring: heart rate Approach: midline Injection technique: LOR air and LOR saline  Needle:  Needle type: Tuohy  Needle gauge: 17 G Needle length: 9 cm Needle insertion depth: 7 cm Catheter type: closed end flexible Catheter size: 19 Gauge Catheter at skin depth: 13 cm Test dose: negative  Assessment Sensory level: T8 Events: blood not aspirated, injection not painful, no injection resistance, negative IV test and no paresthesia  Additional Notes Reason for block:procedure for pain

## 2013-02-05 NOTE — Progress Notes (Signed)
  Subjective: Stepped in to room to check on pt and SVE. After leaving the room FHT started a decel, [facility]  Objective: BP 122/81  Pulse 86  Temp(Src) 98.4 F (36.9 C) (Oral)  Resp 18  Ht 5\' 2"  (1.575 m)  Wt 216 lb 12.8 oz (98.34 kg)  BMI 39.64 kg/m2  SpO2 99%      FHT:  Cat II - position change, bolus UC:   regular, every 2-4 minutes  SVE:   Dilation: 6.5 Effacement (%): 100 Station: -1 Exam by:: J.Oaxley,   Assessment / Plan:  Spontaneous labor, progressing normally  Labor: Progressing normally  Preeclampsia: no signs or symptoms of toxicity  Fetal Wellbeing: Category II  Pain Control: Epidural  I/D: GBS neg; SROM of unknown timing; Afebrile    Anticipated MOD: NSVD   Nicole [name] 02/05/2013, 11:14 PM

## 2013-02-05 NOTE — Progress Notes (Signed)
  Subjective: Pt is tearful with intense UCs.  FOB, family and friends right by her side offering support. Pt requested Stadol at first when asked after SVE pt requested epidural.  Objective: BP 132/89  Pulse 83  Temp(Src) 98 F (36.7 C) (Oral)  Resp 20  Ht 5\' 2"  (1.575 m)  Wt 216 lb 12.8 oz (98.34 kg)  BMI 39.64 kg/m2      FHT:  Cat I UC:   regular, every 2-4 minutes  SVE:   Dilation: 3 Effacement (%): 80 Station: -2 Exam by:: J Oaxley CNM  Assessment / Plan:  Spontaneous labor, progressing normally  Labor: Progressing normally  Preeclampsia: no signs or symptoms of toxicity  Fetal Wellbeing: Category I  Pain Control: Epidural requested I/D: GBS neg; SROM arouund 1400 though time is unclear; Afebrile  Anticipated MOD: NSVD   Nicole [name] 02/05/2013, 7:30 PM

## 2013-02-05 NOTE — MAU Provider Note (Signed)
History   28 yo G1P0 at 38 weeks presented unannounced c/o ? Leaking today, with mucusy d/c, and contractions q 3-4 min at home, now less close together.  Cervix has been 1 cm in the office.  Patient Active Problem List   Diagnosis Date Noted  . Seizure--2013 x 1, no etiology 02/05/2013  . MRSA (methicillin resistant staph aureus) [facility] 02/05/[facility]  . Fetal pyelectasis--bilateral 02/05/2013  . Fetal cardiac echogenic focus--LV 02/05/2013  . Late prenatal care 02/05/2013  . GBS (group B Streptococcus carrier), +RV culture, currently pregnant 02/05/2013     Chief Complaint  Patient presents with  . Labor Eval   HPI  OB History   Grav Para Term Preterm Abortions TAB SAB Ect Mult Living   1               Past Medical History  Diagnosis Date  . Seizures     havnt had seizure in two years    Past Surgical History  Procedure Laterality Date  . Surgery of lip    . Surgery of lip      History reviewed. No pertinent family history.  History  Substance Use Topics  . Smoking status: Never Smoker   . Smokeless tobacco: Never Used  . Alcohol Use: Yes    Allergies: No Known Allergies  Prescriptions prior to admission  Medication Sig Dispense Refill  . Prenatal Vit-Fe Fumarate-FA (PRENATAL MULTIVITAMIN) TABS tablet Take 1 tablet by mouth daily at 12 noon.        ROS:  Contractions, +FM, leaking/discharge Physical Exam   Blood pressure 139/88, pulse 82, temperature 97.8 F (36.6 C), temperature source Oral, resp. rate 18, height 5\' 2"  (1.575 m), weight 216 lb 12.8 oz (98.34 kg).  Physical Exam Chest clear Heart RRR without murmur Abd gravid, NT Pelvic--anterior, loose 1 cm, 100%, vtx, -1, membranes palpated Ext WNL  FHR Category 1 UCs q 5 min, moderate.  ED Course  IUP at 38 weeks Early vs prodromal labor ? Leaking fluid  Plan: Amnisure Observe x 1-2 hours--patient declines ambulation.   Recheck cervix.   Nigel BridgemanLATHAM, Andreina Outten CNM,  [place] 02/05/2013 4:22 PM

## 2013-02-05 NOTE — MAU Note (Signed)
Patient states she is having contractions every 3-4 minutes. States she was 1 1/2 cm on Friday. States she has had a little discharge after contractions. Reports good fetal movement.

## 2013-02-06 ENCOUNTER — Encounter (HOSPITAL_COMMUNITY)
Admission: AD | Disposition: A | Discharge status: Home / Self Care | Payer: Self-pay | Source: Ambulatory Visit | Attending: Obstetrics and Gynecology

## 2013-02-06 ENCOUNTER — Encounter (HOSPITAL_COMMUNITY): Payer: Self-pay | Admitting: General Practice

## 2013-02-06 ENCOUNTER — Inpatient Hospital Stay (HOSPITAL_COMMUNITY): Payer: Medicaid Other

## 2013-02-06 HISTORY — PX: CYSTOSCOPY: SHX5120

## 2013-02-06 LAB — CBC
HEMATOCRIT: 32.9 % — AB (ref 36.0–46.0)
Hemoglobin: 11.1 g/dL — ABNORMAL LOW (ref 12.0–15.0)
MCH: 30.5 pg (ref 26.0–34.0)
MCHC: 33.7 g/dL (ref 30.0–36.0)
MCV: 90.4 fL (ref 78.0–100.0)
Platelets: 169 10*3/uL (ref 150–400)
RBC: 3.64 MIL/uL — ABNORMAL LOW (ref 3.87–5.11)
RDW: 16.1 % — AB (ref 11.5–15.5)
WBC: 12.2 10*3/uL — ABNORMAL HIGH (ref 4.0–10.5)

## 2013-02-06 LAB — RPR: RPR Ser Ql: NONREACTIVE

## 2013-02-06 SURGERY — Surgical Case
Anesthesia: Epidural

## 2013-02-06 SURGERY — Surgical Case
Anesthesia: Choice

## 2013-02-06 MED ORDER — KETOROLAC TROMETHAMINE 60 MG/2ML IM SOLN
INTRAMUSCULAR | Status: AC
  Administered 2013-02-06: 60 mg via INTRAMUSCULAR
  Filled 2013-02-06: qty 2

## 2013-02-06 MED ORDER — METHYLENE BLUE 1 % INJ SOLN
INTRAMUSCULAR | Status: DC | PRN
  Administered 2013-02-06: 50 mg via INTRAVENOUS

## 2013-02-06 MED ORDER — OXYTOCIN 40 UNITS IN LACTATED RINGERS INFUSION - SIMPLE MED: 62.5000 mL/h | INTRAVENOUS | Status: AC

## 2013-02-06 MED ORDER — ZOLPIDEM TARTRATE 5 MG PO TABS: 5.0000 mg | ORAL_TABLET | Freq: Every evening | ORAL | Status: DC | PRN

## 2013-02-06 MED ORDER — DIPHENHYDRAMINE HCL 50 MG/ML IJ SOLN: 12.5000 mg | INTRAMUSCULAR | Status: DC | PRN

## 2013-02-06 MED ORDER — OXYTOCIN 10 UNIT/ML IJ SOLN
INTRAMUSCULAR | Status: AC
  Filled 2013-02-06: qty 4

## 2013-02-06 MED ORDER — METOCLOPRAMIDE HCL 5 MG/ML IJ SOLN: 10.0000 mg | Freq: Three times a day (TID) | INTRAMUSCULAR | Status: DC | PRN

## 2013-02-06 MED ORDER — SCOPOLAMINE 1 MG/3DAYS TD PT72
MEDICATED_PATCH | TRANSDERMAL | Status: AC
  Administered 2013-02-06: 1.5 mg via TRANSDERMAL
  Filled 2013-02-06: qty 1

## 2013-02-06 MED ORDER — DIBUCAINE 1 % RE OINT: 1.0000 "application " | TOPICAL_OINTMENT | RECTAL | Status: DC | PRN

## 2013-02-06 MED ORDER — BUPIVACAINE-EPINEPHRINE (PF) 0.5% -1:200000 IJ SOLN
INTRAMUSCULAR | Status: AC
  Filled 2013-02-06: qty 10

## 2013-02-06 MED ORDER — CEFAZOLIN SODIUM 1-5 GM-% IV SOLN
1.0000 g | Freq: Three times a day (TID) | INTRAVENOUS | Status: DC
  Administered 2013-02-06 – 2013-02-07 (×4): 1 g via INTRAVENOUS
  Filled 2013-02-06 (×4): qty 50

## 2013-02-06 MED ORDER — OXYCODONE-ACETAMINOPHEN 5-325 MG PO TABS
1.0000 | ORAL_TABLET | ORAL | Status: DC | PRN
  Administered 2013-02-06 – 2013-02-07 (×3): 1 via ORAL
  Filled 2013-02-06 (×3): qty 1

## 2013-02-06 MED ORDER — LACTATED RINGERS IV SOLN
INTRAVENOUS | Status: DC | PRN
  Administered 2013-02-06: 02:00:00 via INTRAVENOUS

## 2013-02-06 MED ORDER — DIPHENHYDRAMINE HCL 25 MG PO CAPS: 25.0000 mg | ORAL_CAPSULE | Freq: Four times a day (QID) | ORAL | Status: DC | PRN

## 2013-02-06 MED ORDER — MEPERIDINE HCL 25 MG/ML IJ SOLN: 6.2500 mg | INTRAMUSCULAR | Status: DC | PRN

## 2013-02-06 MED ORDER — CEFAZOLIN SODIUM-DEXTROSE 2-3 GM-% IV SOLR
INTRAVENOUS | Status: DC | PRN
  Administered 2013-02-06: 2 g via INTRAVENOUS

## 2013-02-06 MED ORDER — SIMETHICONE 80 MG PO CHEW
80.0000 mg | CHEWABLE_TABLET | Freq: Three times a day (TID) | ORAL | Status: DC
  Administered 2013-02-06 – 2013-02-08 (×4): 80 mg via ORAL
  Filled 2013-02-06 (×5): qty 1

## 2013-02-06 MED ORDER — PROMETHAZINE HCL 25 MG/ML IJ SOLN: 6.2500 mg | INTRAMUSCULAR | Status: DC | PRN

## 2013-02-06 MED ORDER — HYDROMORPHONE HCL PF 1 MG/ML IJ SOLN
0.2500 mg | INTRAMUSCULAR | Status: DC | PRN
Start: 2013-02-06 — End: 2013-02-06
  Administered 2013-02-06 (×2): 0.5 mg via INTRAVENOUS

## 2013-02-06 MED ORDER — KETOROLAC TROMETHAMINE 30 MG/ML IJ SOLN: 30.0000 mg | Freq: Four times a day (QID) | INTRAMUSCULAR | Status: AC | PRN

## 2013-02-06 MED ORDER — NALBUPHINE HCL 10 MG/ML IJ SOLN: 5.0000 mg | INTRAMUSCULAR | Status: DC | PRN

## 2013-02-06 MED ORDER — ONDANSETRON HCL 4 MG PO TABS: 4.0000 mg | ORAL_TABLET | ORAL | Status: DC | PRN

## 2013-02-06 MED ORDER — TETANUS-DIPHTH-ACELL PERTUSSIS 5-2.5-18.5 LF-MCG/0.5 IM SUSP: 0.5000 mL | Freq: Once | INTRAMUSCULAR | Status: DC

## 2013-02-06 MED ORDER — MEPERIDINE HCL 25 MG/ML IJ SOLN
INTRAMUSCULAR | Status: DC | PRN
  Administered 2013-02-06 (×2): 12.5 mg via INTRAVENOUS

## 2013-02-06 MED ORDER — CEFAZOLIN SODIUM 1-5 GM-% IV SOLN
1.0000 g | Freq: Three times a day (TID) | INTRAVENOUS | Status: DC
  Filled 2013-02-06 (×2): qty 50

## 2013-02-06 MED ORDER — DIPHENHYDRAMINE HCL 25 MG PO CAPS: 25.0000 mg | ORAL_CAPSULE | ORAL | Status: DC | PRN

## 2013-02-06 MED ORDER — SENNOSIDES-DOCUSATE SODIUM 8.6-50 MG PO TABS
2.0000 | ORAL_TABLET | ORAL | Status: DC
  Administered 2013-02-07 (×2): 2 via ORAL
  Filled 2013-02-06 (×2): qty 2

## 2013-02-06 MED ORDER — SODIUM BICARBONATE 8.4 % IV SOLN
INTRAVENOUS | Status: AC
  Filled 2013-02-06: qty 50

## 2013-02-06 MED ORDER — MEASLES, MUMPS & RUBELLA VAC ~~LOC~~ INJ
0.5000 mL | INJECTION | Freq: Once | SUBCUTANEOUS | Status: DC
  Filled 2013-02-06: qty 0.5

## 2013-02-06 MED ORDER — CEFAZOLIN SODIUM-DEXTROSE 2-3 GM-% IV SOLR
INTRAVENOUS | Status: AC
  Filled 2013-02-06: qty 50

## 2013-02-06 MED ORDER — CEFAZOLIN SODIUM-DEXTROSE 2-3 GM-% IV SOLR: 2.0000 g | INTRAVENOUS | Status: DC

## 2013-02-06 MED ORDER — MORPHINE SULFATE (PF) 0.5 MG/ML IJ SOLN
INTRAMUSCULAR | Status: DC | PRN
  Administered 2013-02-06: 4 mg via EPIDURAL

## 2013-02-06 MED ORDER — PHENYLEPHRINE HCL 10 MG/ML IJ SOLN
INTRAMUSCULAR | Status: DC | PRN
  Administered 2013-02-06: 40 ug via INTRAVENOUS

## 2013-02-06 MED ORDER — MIDAZOLAM HCL 2 MG/2ML IJ SOLN
INTRAMUSCULAR | Status: AC
Start: 2013-02-06 — End: 2013-02-06
  Filled 2013-02-06: qty 2

## 2013-02-06 MED ORDER — DIPHENHYDRAMINE HCL 50 MG/ML IJ SOLN: 25.0000 mg | INTRAMUSCULAR | Status: DC | PRN

## 2013-02-06 MED ORDER — SIMETHICONE 80 MG PO CHEW
80.0000 mg | CHEWABLE_TABLET | ORAL | Status: DC
  Administered 2013-02-07 (×2): 80 mg via ORAL
  Filled 2013-02-06 (×2): qty 1

## 2013-02-06 MED ORDER — LIDOCAINE-EPINEPHRINE (PF) 2 %-1:200000 IJ SOLN
INTRAMUSCULAR | Status: AC
  Filled 2013-02-06: qty 20

## 2013-02-06 MED ORDER — SCOPOLAMINE 1 MG/3DAYS TD PT72
1.0000 | MEDICATED_PATCH | Freq: Once | TRANSDERMAL | Status: DC
  Administered 2013-02-06: 1.5 mg via TRANSDERMAL

## 2013-02-06 MED ORDER — KETOROLAC TROMETHAMINE 60 MG/2ML IM SOLN
60.0000 mg | Freq: Once | INTRAMUSCULAR | Status: AC | PRN
  Administered 2013-02-06: 60 mg via INTRAMUSCULAR

## 2013-02-06 MED ORDER — MIDAZOLAM HCL 2 MG/2ML IJ SOLN
INTRAMUSCULAR | Status: DC | PRN
  Administered 2013-02-06: 2 mg via INTRAVENOUS

## 2013-02-06 MED ORDER — SODIUM BICARBONATE 8.4 % IV SOLN
INTRAVENOUS | Status: DC | PRN
  Administered 2013-02-06: 5 mL via EPIDURAL

## 2013-02-06 MED ORDER — ONDANSETRON HCL 4 MG/2ML IJ SOLN: 4.0000 mg | INTRAMUSCULAR | Status: DC | PRN

## 2013-02-06 MED ORDER — HYDROMORPHONE HCL PF 1 MG/ML IJ SOLN
INTRAMUSCULAR | Status: AC
  Administered 2013-02-06: 0.5 mg via INTRAVENOUS
  Filled 2013-02-06: qty 1

## 2013-02-06 MED ORDER — LACTATED RINGERS IV SOLN
INTRAVENOUS | Status: DC
  Administered 2013-02-06 – 2013-02-07 (×3): via INTRAVENOUS

## 2013-02-06 MED ORDER — LANOLIN HYDROUS EX OINT: 1.0000 "application " | TOPICAL_OINTMENT | CUTANEOUS | Status: DC | PRN

## 2013-02-06 MED ORDER — DEXTROSE 5 % IV SOLN
INTRAVENOUS | Status: DC | PRN
  Administered 2013-02-06: 03:00:00 via INTRAVENOUS

## 2013-02-06 MED ORDER — NALOXONE HCL 1 MG/ML IJ SOLN
1.0000 ug/kg/h | INTRAVENOUS | Status: DC | PRN
  Filled 2013-02-06: qty 2

## 2013-02-06 MED ORDER — PRENATAL MULTIVITAMIN CH
1.0000 | ORAL_TABLET | Freq: Every day | ORAL | Status: DC
  Administered 2013-02-07 – 2013-02-08 (×2): 1 via ORAL
  Filled 2013-02-06 (×2): qty 1

## 2013-02-06 MED ORDER — ONDANSETRON HCL 4 MG/2ML IJ SOLN: 4.0000 mg | Freq: Three times a day (TID) | INTRAMUSCULAR | Status: DC | PRN

## 2013-02-06 MED ORDER — IBUPROFEN 600 MG PO TABS
600.0000 mg | ORAL_TABLET | Freq: Four times a day (QID) | ORAL | Status: DC
  Administered 2013-02-07 – 2013-02-08 (×7): 600 mg via ORAL
  Filled 2013-02-06 (×7): qty 1

## 2013-02-06 MED ORDER — MEDROXYPROGESTERONE ACETATE 150 MG/ML IM SUSP
150.0000 mg | INTRAMUSCULAR | Status: DC | PRN
Start: 2013-02-06 — End: 2013-02-08

## 2013-02-06 MED ORDER — MORPHINE SULFATE 0.5 MG/ML IJ SOLN
INTRAMUSCULAR | Status: AC
  Filled 2013-02-06: qty 10

## 2013-02-06 MED ORDER — MORPHINE SULFATE (PF) 0.5 MG/ML IJ SOLN
INTRAMUSCULAR | Status: DC | PRN
  Administered 2013-02-06 (×2): .5 mg via EPIDURAL

## 2013-02-06 MED ORDER — LACTATED RINGERS IV SOLN
INTRAVENOUS | Status: DC
  Administered 2013-02-06: 01:00:00 via INTRAUTERINE
  Administered 2013-02-06: 200 mL/h via INTRAUTERINE

## 2013-02-06 MED ORDER — OXYTOCIN 10 UNIT/ML IJ SOLN
40.0000 [IU] | INTRAVENOUS | Status: DC | PRN
  Administered 2013-02-06: 40 [IU] via INTRAVENOUS

## 2013-02-06 MED ORDER — SODIUM CHLORIDE 0.9 % IJ SOLN: 3.0000 mL | INTRAMUSCULAR | Status: DC | PRN

## 2013-02-06 MED ORDER — ONDANSETRON HCL 4 MG/2ML IJ SOLN
INTRAMUSCULAR | Status: DC | PRN
  Administered 2013-02-06: 4 mg via INTRAVENOUS

## 2013-02-06 MED ORDER — PRENATAL MULTIVITAMIN CH: 1.0000 | ORAL_TABLET | Freq: Every day | ORAL | Status: DC

## 2013-02-06 MED ORDER — NALOXONE HCL 0.4 MG/ML IJ SOLN: 0.4000 mg | INTRAMUSCULAR | Status: DC | PRN

## 2013-02-06 MED ORDER — BUPIVACAINE-EPINEPHRINE 0.5% -1:200000 IJ SOLN
INTRAMUSCULAR | Status: DC | PRN
  Administered 2013-02-06: 10 mL

## 2013-02-06 MED ORDER — MENTHOL 3 MG MT LOZG: 1.0000 | LOZENGE | OROMUCOSAL | Status: DC | PRN

## 2013-02-06 MED ORDER — KETOROLAC TROMETHAMINE 30 MG/ML IJ SOLN
30.0000 mg | Freq: Four times a day (QID) | INTRAMUSCULAR | Status: AC | PRN
  Administered 2013-02-06: 30 mg via INTRAVENOUS
  Filled 2013-02-06: qty 1

## 2013-02-06 MED ORDER — WITCH HAZEL-GLYCERIN EX PADS: 1.0000 "application " | MEDICATED_PAD | CUTANEOUS | Status: DC | PRN

## 2013-02-06 MED ORDER — METHYLENE BLUE 1 % INJ SOLN
INTRAMUSCULAR | Status: AC
  Filled 2013-02-06: qty 10

## 2013-02-06 MED ORDER — MEPERIDINE HCL 25 MG/ML IJ SOLN
INTRAMUSCULAR | Status: AC
  Filled 2013-02-06: qty 1

## 2013-02-06 MED ORDER — SIMETHICONE 80 MG PO CHEW: 80.0000 mg | CHEWABLE_TABLET | ORAL | Status: DC | PRN

## 2013-02-06 MED ORDER — ONDANSETRON HCL 4 MG/2ML IJ SOLN
INTRAMUSCULAR | Status: AC
  Filled 2013-02-06: qty 2

## 2013-02-06 SURGICAL SUPPLY — 42 items
CLAMP CORD UMBIL (MISCELLANEOUS) IMPLANT
CLOTH BEACON ORANGE TIMEOUT ST (SAFETY) ×3 IMPLANT
CONTAINER PREFILL 10% NBF 15ML (MISCELLANEOUS) IMPLANT
DRAIN JACKSON PRT FLT 7MM (DRAIN) IMPLANT
DRAPE LG THREE QUARTER DISP (DRAPES) IMPLANT
DRSG OPSITE POSTOP 4X10 (GAUZE/BANDAGES/DRESSINGS) ×3 IMPLANT
DURAPREP 26ML APPLICATOR (WOUND CARE) ×3 IMPLANT
ELECT REM PT RETURN 9FT ADLT (ELECTROSURGICAL) ×3
ELECTRODE REM PT RTRN 9FT ADLT (ELECTROSURGICAL) ×1 IMPLANT
EVACUATOR SILICONE 100CC (DRAIN) IMPLANT
EXTRACTOR VACUUM M CUP 4 TUBE (SUCTIONS) IMPLANT
EXTRACTOR VACUUM M CUP 4' TUBE (SUCTIONS)
GLOVE BIOGEL PI IND STRL 8.5 (GLOVE) ×1 IMPLANT
GLOVE BIOGEL PI INDICATOR 8.5 (GLOVE) ×2
GLOVE ECLIPSE 8.0 STRL XLNG CF (GLOVE) ×6 IMPLANT
GOWN STRL REUS W/ TWL XL LVL3 (GOWN DISPOSABLE) ×1 IMPLANT
GOWN STRL REUS W/TWL 2XL LVL3 (GOWN DISPOSABLE) ×3 IMPLANT
GOWN STRL REUS W/TWL LRG LVL3 (GOWN DISPOSABLE) ×3 IMPLANT
GOWN STRL REUS W/TWL XL LVL3 (GOWN DISPOSABLE) ×3
KIT ABG SYR 3ML LUER SLIP (SYRINGE) IMPLANT
NDL HYPO 25X1 1.5 SAFETY (NEEDLE) ×1 IMPLANT
NDL HYPO 25X5/8 SAFETYGLIDE (NEEDLE) IMPLANT
NEEDLE HYPO 25X1 1.5 SAFETY (NEEDLE) ×3 IMPLANT
NEEDLE HYPO 25X5/8 SAFETYGLIDE (NEEDLE) IMPLANT
PACK C SECTION WH (CUSTOM PROCEDURE TRAY) ×3 IMPLANT
PAD OB MATERNITY 4.3X12.25 (PERSONAL CARE ITEMS) ×3 IMPLANT
RINGERS IRRIG 1000ML POUR BTL (IV SOLUTION) ×3 IMPLANT
STAPLER VISISTAT 35W (STAPLE) IMPLANT
SUT MNCRL AB 3-0 PS2 27 (SUTURE) ×2 IMPLANT
SUT PLAIN 0 NONE (SUTURE) IMPLANT
SUT SILK 3 0 FS 1X18 (SUTURE) IMPLANT
SUT VIC AB 0 CT1 27 (SUTURE) ×6
SUT VIC AB 0 CT1 27XBRD ANBCTR (SUTURE) ×2 IMPLANT
SUT VIC AB 2-0 CTX 36 (SUTURE) ×6 IMPLANT
SUT VIC AB 3-0 CT1 27 (SUTURE)
SUT VIC AB 3-0 CT1 TAPERPNT 27 (SUTURE) IMPLANT
SUT VIC AB 3-0 SH 27 (SUTURE)
SUT VIC AB 3-0 SH 27X BRD (SUTURE) IMPLANT
SYR CONTROL 10ML LL (SYRINGE) ×3 IMPLANT
TOWEL OR 17X24 6PK STRL BLUE (TOWEL DISPOSABLE) ×3 IMPLANT
TRAY FOLEY CATH 14FR (SET/KITS/TRAYS/PACK) ×1 IMPLANT
WATER STERILE IRR 1000ML POUR (IV SOLUTION) ×1 IMPLANT

## 2013-02-06 NOTE — Progress Notes (Signed)
  Subjective: Pt continues to have deep decels with out adequate MVUs.  C/w Dr. Stefano GaulStringer and asked him to come in for c/s.  R&B of c/s were reviewed with the patient, including bleeding, infection, and damage to other organs -- patient verbalizes understanding of these risks and wishes to proceed   Objective: BP 118/73  Pulse 89  Temp(Src) 98.4 F (36.9 C) (Oral)  Resp 18  Ht 5\' 2"  (1.575 m)  Wt 216 lb 12.8 oz (98.34 kg)  BMI 39.64 kg/m2  SpO2 99%   Total I/O In: -  Out: 350 [Urine:350]  FHT:  Cat II UC:   regular, every 6 minutes  SVE:   Dilation: 7 Effacement (%): 90 Station: -1 Exam by:: J Nancy Manuele CNM  Assessment / Plan:  Contining NRFHT, proceeding to c/s  Labor: Inadequate MVUs; NRFHTs  Preeclampsia: no signs or symptoms of toxicity  Fetal Wellbeing: Category II  Pain Control: Epidural  I/D: GBS neg; SROM of unknown timing; Afebrile   Anticipated MOD: C/s   Nicole [name] 02/06/2013, 1:32 AM

## 2013-02-06 NOTE — Progress Notes (Signed)
The pt has recurrent deep decels in spite of an amnioinfusion. Cx is not changing. CS offered and accepted. R and B reviewed.  Dr. Stefano GaulStringer

## 2013-02-06 NOTE — Lactation Note (Addendum)
This note was copied from the chart of Nicole Garry Idrovo. Lactation Consultation Note Follow up at 20 hours of age.  Feedings and output updated.  Mom denies concerns or pain with feedings and reports everything is going well.  Mom to call for assist as needed. Encouraged mbu rn to attempt latch score this shift.    Patient Name: Nicole [name] Reason for consult: Follow-up assessment   Maternal Data    Feeding Feeding Type: Breast Fed Length of feed: 15 min  LATCH Score/Interventions                Intervention(s): Breastfeeding basics reviewed     Lactation Tools Discussed/Used     Consult Status Consult Status: Follow-up Date: 02/07/13 Follow-up type: In-patient    Jannifer RodneyShoptaw, [name], 10:43 PM

## 2013-02-06 NOTE — Progress Notes (Signed)
  [facility]  RN did position change with return to BL.  Objective: BP 134/79  Pulse 90  Temp(Src) 98.4 F (36.9 C) (Oral)  Resp 18  Ht 5\' 2"  (1.575 m)  Wt 216 lb 12.8 oz (98.34 kg)  BMI 39.64 kg/m2  SpO2 99%      FHT:  Cat II - position change UC:   regular, every 2-4 minutes  SVE:   Dilation: 7 Effacement (%): 90 Station: -1 Exam by:: J Patton Swisher CNM Cervix feels like it may be a little edematous.  Assessment / Plan:  Spontaneous labor, progressing normally  Labor: Progressing normally  Preeclampsia: no signs or symptoms of toxicity  Fetal Wellbeing: Category II  Pain Control: Epidural  I/D: GBS neg; SROM of unknown timing; Afebrile   Anticipated MOD: NSVD   Petra Sargeant 02/06/2013, 12:26 AM

## 2013-02-06 NOTE — Anesthesia Postprocedure Evaluation (Signed)
Anesthesia Post Note  Patient: Nicole [name]  Procedure(s) Performed: Procedure(s) (LRB): CESAREAN SECTION (N/A) CYSTOSCOPY (N/A)  Anesthesia type: Epidural  Patient location: PACU  Post pain: Pain level controlled  Post assessment: Post-op Vital signs reviewed  Last Vitals: BP 118/74  Pulse 108  Temp(Src) 37 C (Oral)  Resp 20  Ht 5\' 2"  (1.575 m)  Wt 216 lb 12.8 oz (98.34 kg)  BMI 39.64 kg/m2  SpO2 98%  Post vital signs: Reviewed  Level of consciousness: sedated  Complications: No apparent anesthesia complications

## 2013-02-06 NOTE — Anesthesia Postprocedure Evaluation (Signed)
  Anesthesia Post-op Note  Patient: Nicole [name]  Procedure(s) Performed: Procedure(s): CESAREAN SECTION (N/A) CYSTOSCOPY (N/A)  Patient Location: Mother/Baby  Anesthesia Type:Epidural  Level of Consciousness: awake, alert  and oriented  Airway and Oxygen Therapy: Patient Spontanous Breathing  Post-op Pain: none  Post-op Assessment: Post-op Vital signs reviewed, Patient's Cardiovascular Status Stable, Respiratory Function Stable, No headache, No backache, No residual numbness and No residual motor weakness  Post-op Vital Signs: Reviewed and stable  Complications: No apparent anesthesia complications

## 2013-02-06 NOTE — Op Note (Signed)
OPERATIVE NOTE  Patient's Name: Nicole [name]  Date of Birth: 05-Oct-1985  Medical Records Number: [id number]  Date of Operation: 02/06/2013  Preoperative diagnosis:  [redacted]w[redacted]d weeks gestation  Fetal heart deceleration [763.83]  Postoperative diagnosis:  [redacted]w[redacted]d weeks gestation  Fetal heart deceleration [763.83]  Umbilical cord tied around the baby's legs and feet  Extension of the uterine incision  Occiput posterior presentation  Procedure:  Primary low transverse cesarean section  Cystoscopy  Surgeon:  Leonard Schwartz, M.D.  Assistant:  Haroldine Laws, certified nurse midwife  Anesthesia:  Regional  Disposition:  DYANN GOODSPEED is a 28 y.o. female, G2P0, who presents at [redacted]w[redacted]d weeks gestation. The patient has been followed at the [facility] obstetrics and gynecology division of [facility] for women. She has the above mentioned diagnosis. She understands the indications for her procedure and she accepts the risk of, but not limited to, anesthetic complications, bleeding, infections, and possible damage to the surrounding organs.  Findings:  A female (Achilles) was delivered from a occiput posterior position.  The Apgar scores were 7/9 . The uterus, fallopian tubes, and ovaries were normal for the gravid state.  Procedure:  The patient was taken to the operating room where the epidural the patient had received her labor was thought to be adequate for cesarean delivery. The patient's abdomen was prepped with Chloraprep. A Foley catheter was placed on labor and delivery. The patient was sterilely draped. A "timeout" was performed which properly identified the patient and the correct operative procedure. The lower abdomen was injected with half percent Marcaine with epinephrine. A low transverse incision was made in the abdomen and carried sharply through the subcutaneous tissue, the fascia, and the anterior peritoneum. An incision was made in the  lower uterine segment. The incision was extended in a low transverse fashion. The fetal head was delivered without difficulty. The shoulders were slightly difficult to deliver. The remainder of the infant was then delivered. The cord was clamped and cut. The infant was handed to the awaiting pediatric team. The placenta was removed. The uterine cavity was cleaned of amniotic fluid, clotted blood, and membranes. The uterine incision was closed using a running locking suture of 2-0 Vicryl. There was an extension of the uterine incision to the right. Care was taken to close the incision without damaging vital structures in the area. An imbricating suture of 2-0 Vicryl was placed. The pelvis was vigorously irrigated. Hemostasis was adequate. The anterior peritoneum and the abdominal musculature were closed using 2-0 Vicryl. The fascia was closed using a running suture of 0 Vicryl followed by 3 interrupted sutures of 0 Vicryl. The subcutaneous layer was closed using interrupted sutures of 2-0 Vicryl. The skin was reapproximated using a subcuticular suture of 3-0 Monocryl. The patient was given an ampule of methylene blue dye. She was also given a bolus of D50. The patient was placed in a lithotomy position. The Foley catheter was removed. The perineum was prepped with Betadine and draped. The cystoscope was inserted into the bladder. No damage was noted. The ureteral orifice on the right was noted to have normal motion and I believe I could see fluid passed through the opening. Blue dye did not enter the bladder from either ureter however. I will order an ultrasound to document ureteral jets. The cystoscope was removed. A Foley catheter was replaced under sterile conditions. Sponge, needle, and instrument counts were correct on 2 occasions. The estimated blood loss for the procedure was 650 cc's. The  patient tolerated her procedure well. She was transported to the recovery room in stable condition. The infant was taken  to the full-term nursery in stable condition after remain with the mother for skin to skin. The placenta was sent to labor and delivery.  Leonard SchwartzArthur Vernon Leyton Magoon, M.D.

## 2013-02-06 NOTE — Transfer of Care (Signed)
Immediate Anesthesia Transfer of Care Note  Patient: Nicole [name]  Procedure(s) Performed: Procedure(s): CESAREAN SECTION (N/A) CYSTOSCOPY (N/A)  Patient Location: PACU  Anesthesia Type:Epidural  Level of Consciousness: awake, alert  and oriented  Airway & Oxygen Therapy: Patient Spontanous Breathing  Post-op Assessment: Report given to PACU RN and Post -op Vital signs reviewed and stable  Post vital signs: Reviewed and stable  Complications: No apparent anesthesia complications

## 2013-02-06 NOTE — Progress Notes (Signed)
UR chart review completed.  

## 2013-02-07 ENCOUNTER — Encounter (HOSPITAL_COMMUNITY): Payer: Self-pay | Admitting: Obstetrics and Gynecology

## 2013-02-07 NOTE — Progress Notes (Addendum)
Patient ID: Leighton Roachmbrosia R Roznowski, female   DOB: 11/04/1985, 28 y.o.   MRN: [id number] Subjective: Postpartum Day 1: [facility] Delivery [facility]  Patient reports tolerating PO.   Foley still in place  no complaints,  Pain well controlled with po meds  Mood stable, bonding well Contraception: undecided   Objective: Vital signs in last 24 hours: Temp:  [97.5 F (36.4 C)-99.4 F (37.4 C)] 98.3 F (36.8 C) (02/03 0534) Pulse Rate:  [97-115] 97 (02/03 0534) Resp:  [16-24] 19 (02/03 0534) BP: (106-135)/(62-80) 134/69 mmHg (02/03 0534) SpO2:  [94 %-98 %] 96 % (02/03 0244)  Physical Exam:  General: alert and no distress Heart: RRR Lungs: CTAB Abdomen: BS x4 Uterine Fundus: firm Incision: dressing CDI     Lochia: appropriate DVT Evaluation: No evidence of DVT seen on physical exam. Negative Homan's sign. No significant calf/ankle edema.   Recent Labs  02/05/13 1640 02/06/13 0435  HGB 12.8 11.1*  HCT 37.5 32.9*    Assessment/Plan: Status post Cesarean section. Doing well postoperatively.  Continue current care.   LILLARD,SHELLEY M 02/07/2013, 8:19 AM   Pt is tearful about bililights.  RN reports pt appearing flat and this kind of mad it worse.  Pt has not passed flatus but is tolerating po.   Foley is still in place.  With nl output and nl u/s documented, will d/c foley and cont strict i's/o's.  Will get SW consult and cont to monitor for signs of pp depression and need for possible medication.  Pt has been afebrile.  Will d/c ancef.

## 2013-02-07 NOTE — Lactation Note (Signed)
This note was copied from the chart of Nicole [name]. Lactation Consultation Note  Patient Name: Nicole [name] Reason for consult: Follow-up assessment;Hyperbilirubinemia (baby currently under TRIPLE phototherapy) and is sound asleep.  Mom states she last breastfed baby at [id number] and is also feeding with formula because of the jaundice and phototx.  She has hand pump in room but has not been pumping today.  Baby is taking 10-60 ml's of formula but RN, [name] informed patient that baby does not need so much formula at 42 hours of age.  LC encouraged her to always breastfeed first and to pump with hand (or request electric) pump if unable to breastfeed at least every 3 hours.   Maternal Data    Feeding Feeding Type: Bottle Fed - Formula  LATCH Score/Interventions                      Lactation Tools Discussed/Used   Breastfeeding first and/or pumping for additional stimulation (may feed ebm as available instead of formula)  Consult Status Consult Status: Follow-up Date: 02/08/13 Follow-up type: In-patient    [name], [name], 9:04 PM

## 2013-02-08 MED ORDER — DOCUSATE SODIUM 100 MG PO CAPS: 100.0000 mg | ORAL_CAPSULE | Freq: Two times a day (BID) | ORAL | Status: AC

## 2013-02-08 MED ORDER — IBUPROFEN 800 MG PO TABS: 800.0000 mg | ORAL_TABLET | Freq: Three times a day (TID) | ORAL | Status: AC | PRN

## 2013-02-08 MED ORDER — FERROUS SULFATE 325 (65 FE) MG PO TABS: 325.0000 mg | ORAL_TABLET | Freq: Two times a day (BID) | ORAL | Status: AC

## 2013-02-08 MED ORDER — OXYCODONE-ACETAMINOPHEN 5-325 MG PO TABS: 1.0000 | ORAL_TABLET | ORAL | Status: AC | PRN

## 2013-02-08 NOTE — Progress Notes (Signed)
Subjective: Postpartum Day 2: Ces[facility] Delivery [facility] to [facility] Patient up ad lib, reports no syncope or dizziness. Feeding:  Breast Contraceptive plan:  Undecided Patient's affect more animated today--baby still under phototherapy.    Objective: Vital signs in last 24 hours: Temp:  [98.1 F (36.7 C)-98.3 F (36.8 C)] 98.3 F (36.8 C) (02/04 0611) Pulse Rate:  [108-110] 110 (02/04 0611) Resp:  [17-20] 17 (02/04 0611) BP: (123-128)/(70-78) 123/78 mmHg (02/04 0611) SpO2:  [98 %] 98 % (02/04 16100611)  Physical Exam:  General: alert Lochia: appropriate Uterine Fundus: firm Incision: Honeycomb dressing in place, CDI DVT Evaluation: No evidence of DVT seen on physical exam. Negative Homan's sign.    Recent Labs  02/05/13 1640 02/06/13 0435  HGB 12.8 11.1*  HCT 37.5 32.9*    Assessment/Plan: Status post Cesarean section day 2. Doing well postoperatively.  Continue current care. Plan for discharge tomorrow Support to patient for concerns regarding baby. SW consult pending.    Nicole [name] 02/08/2013, 7:09 AM

## 2013-02-08 NOTE — Clinical Social Work Maternal (Signed)
Clinical Social Work Department  PSYCHOSOCIAL ASSESSMENT - MATERNAL/CHILD  02/08/2013  Patient: [name],[name] Account Number: [id number] Admit Date: 02/05/2013  Childs Name:  Nicole [name]   Clinical Social Worker: Nicole Koehl, LCSW Date/Time: 02/08/2013 11:50 AM  Date Referred: 02/08/2013  Referral source   CN    Referred reason   Behavioral Health Issues   Other referral source:  I: FAMILY / HOME ENVIRONMENT  Child's legal guardian: PARENT  Guardian - Name  Guardian - Age  Guardian - Address   Nicole [name]  27  [address] [place], [place] [address]   Nicole [name]  26  (same as above)   Other household support members/support persons  Other support:  Nicole [name]-pts sister   II PSYCHOSOCIAL DATA  Information Source: Patient Interview  Financial and Community Resources  Employment:  Financial resources: Medicaid  If Medicaid - County: [place]  Other   Food Stamps   School / Grade:  Maternity Care Coordinator / Child Services Coordination / Early Interventions: Cultural issues impacting care:  III STRENGTHS  Strengths   Adequate Resources   Home prepared for Child (including basic supplies)   Supportive family/friends   Strength comment:  IV RISK FACTORS AND CURRENT PROBLEMS  Current Problem: YES  Risk Factor & Current Problem  Patient Issue  Family Issue  Risk Factor / Current Problem Comment   Other - See comment  Y  N  "tearful/ flat affect"   V SOCIAL WORK ASSESSMENT  CSW referral received to assess pts current situation after her affect was described as "flat" & she was observed crying. Pt is a 27 year old, G2P1 who lives with FOB. She was nursing the infant when CSW entered the room & welcomed this writer to complete assessment. Pt maintained good eye contact & answered CSW questions appropriately. CSW agrees that her affect is flat. CSW inquired about her the source of her emotions yesterday after she was observed crying. Pt told CSW that she became emotional  after learning about the infant bilirubin & possible side effects. Pt states she felt better after speaking to her physician. She reports feeling fine now. CSW observed pt bonding appropriately, while doing skin-to-skin (with bili lights in place). CSW inquired about her lack of emotion during conversation & pt smiled. She said " I was raised by my father & he is just like this too," referring to his demeanor. She denies any depression or SI history. She has all the necessary supplies for the infant. She identified her sister as her primary support person. FOB was asleep at the bedside. CSW provided pt with a Feelings After Birth brochure & encouraged her to seek medical attention if needed. Pt was pleasant & appears receptive to information discussed.   VI SOCIAL WORK PLAN  Social Work Plan   No Further Intervention Required / No Barriers to Discharge   Type of pt/family education:  If child protective services report - county:  If child protective services report - date:  Information/referral to community resources comment:  Other social work plan:       Clinical Social Work Department PSYCHOSOCIAL ASSESSMENT - MATERNAL/CHILD 02/08/2013  Patient:  Nicole, [name]  Account Number:  [id number]  Admit Date:  02/05/2013  Marjo Bicker Name:   Nicole [name]    Clinical Social Worker:  Nicole Putnam, LCSW   Date/Time:  02/08/2013 11:50 AM  Date Referred:  02/08/2013   Referral source  CN     Referred reason  Behavioral Health Issues   Other referral source:    I:  FAMILY / HOME ENVIRONMENT Child's legal guardian:  PARENT  Guardian - Name Guardian - Age Guardian - Address  Nicole [name] [address] [place], [place] [address]  Nicole [name] 26 (same as above)   Other household support members/support persons Other support:   Education officer, museum [name]-pts sister    II  PSYCHOSOCIAL DATA Information Source:  Patient Interview  Event organiser Employment:   Surveyor, quantity resources:  [employer] If Medicaid - County:  [employer] Other  Chemical engineer / Grade:   Maternity Care Coordinator / Child Services Coordination / Early Interventions:  Cultural issues impacting care:    III  STRENGTHS Strengths  Adequate Resources  Home prepared for Child (including basic supplies)  Supportive family/friends   Strength comment:    IV  RISK FACTORS AND CURRENT PROBLEMS Current Problem:  YES   Risk Factor & Current Problem Patient Issue Family Issue Risk Factor / Current Problem Comment  Other - See comment Y N "tearful/ flat affect"    V  SOCIAL WORK ASSESSMENT CSW referral received to assess pts current situation after her affect was described as "flat" & she was observed crying.  Pt is a 28 year old, G2P1 who lives with FOB.  She was nursing the infant when CSW entered the room & welcomed this writer to complete assessment.  Pt maintained good eye contact & answered CSW questions appropriately.  CSW agrees that her affect is flat.  CSW inquired about her the source of her emotions yesterday after she was observed crying. Pt  told CSW that she became emotional after learning about the infant bilirubin & possible side effects.  Pt states she felt better after speaking to her physician.  She reports feeling fine now.  CSW observed pt bonding appropriately, while doing skin-to-skin (with bili lights in place).  CSW inquired about her lack of emotion during conversation & pt smiled.  She said " I was raised by my father & he is just like this too," referring to his demeanor.  She denies any depression or SI history. She has all the necessary supplies for the infant.  She identified her sister as her primary support person.  FOB was asleep at the bedside.  CSW provided pt with a Feelings After Birth brochure & encouraged her to seek medical attention if needed.  Pt was pleasant & appears receptive to information discussed.      VI SOCIAL WORK PLAN Social Work Plan  No Further Intervention Required / No Barriers to Discharge   Type of pt/family education:   If child protective services report - county:   If child protective services report - date:   Information/referral to community resources comment:   Other social work plan:

## 2013-02-08 NOTE — Discharge Instructions (Signed)
Postpartum Depression and Baby Blues °The postpartum period begins right after the birth of a baby. During this time, there is often a great amount of joy and excitement. It is also a time of considerable changes in the life of the parent(s). Regardless of how many times a mother gives birth, each child brings new challenges and dynamics to the family. It is not unusual to have feelings of excitement accompanied by confusing shifts in moods, emotions, and thoughts. All mothers are at risk of developing postpartum depression or the "baby blues." These mood changes can occur right after giving birth, or they may occur many months after giving birth. The baby blues or postpartum depression can be mild or severe. Additionally, postpartum depression can resolve rather quickly, or it can be a long-term condition. °CAUSES °Elevated hormones and their rapid decline are thought to be a main cause of postpartum depression and the baby blues. There are a number of hormones that radically change during and after pregnancy. Estrogen and progesterone usually decrease immediately after delivering your baby. The level of thyroid hormone and various cortisol steroids also rapidly drop. Other factors that play a major role in these changes include major life events and genetics.  °RISK FACTORS °If you have any of the following risks for the baby blues or postpartum depression, know what symptoms to watch out for during the postpartum period. Risk factors that may increase the likelihood of getting the baby blues or postpartum depression include: °· Having a personal or family history of depression. °· Having depression while being pregnant. °· Having premenstrual or oral contraceptive-associated mood issues. °· Having exceptional life stress. °· Having marital conflict. °· Lacking a social support network. °· Having a baby with special needs. °· Having health problems such as diabetes. °SYMPTOMS °Baby blues symptoms include: °· Brief  fluctuations in mood, such as going from extreme happiness to sadness. °· Decreased concentration. °· Difficulty sleeping. °· Crying spells, tearfulness. °· Irritability. °· Anxiety. °Postpartum depression symptoms typically begin within the first month after giving birth. These symptoms include: °· Difficulty sleeping or excessive sleepiness. °· Marked weight loss. °· Agitation. °· Feelings of worthlessness. °· Lack of interest in activity or food. °Postpartum psychosis is a very concerning condition and can be dangerous. Fortunately, it is rare. Displaying any of the following symptoms is cause for immediate medical attention. Postpartum psychosis symptoms include: °· Hallucinations and delusions. °· Bizarre or disorganized behavior. °· Confusion or disorientation. °DIAGNOSIS  °A diagnosis is made by an evaluation of your symptoms. There are no medical or lab tests that lead to a diagnosis, but there are various questionnaires that a caregiver may use to identify those with the baby blues, postpartum depression, or psychosis. Often times, a screening tool called the Edinburgh Postnatal Depression Scale is used to diagnose depression in the postpartum period.  °TREATMENT °The baby blues usually goes away on its own in 1 to 2 weeks. Social support is often all that is needed. You should be encouraged to get adequate sleep and rest. Occasionally, you may be given medicines to help you sleep.  °Postpartum depression requires treatment as it can last several months or longer if it is not treated. Treatment may include individual or group therapy, medicine, or both to address any social, physiological, and psychological factors that may play a role in the depression. Regular exercise, a healthy diet, rest, and social support may also be strongly recommended.  °Postpartum psychosis is more serious and needs treatment right away. Hospitalization is   often needed. HOME CARE INSTRUCTIONS  Get as much rest as you can. Nap  when the baby sleeps.  Exercise regularly. Some women find yoga and walking to be beneficial.  Eat a balanced and nourishing diet.  Do little things that you enjoy. Have a cup of tea, take a bubble bath, read your favorite magazine, or listen to your favorite music.  Avoid alcohol.  Ask for help with household chores, cooking, grocery shopping, or running errands as needed. Do not try to do everything.  Talk to people close to you about how you are feeling. Get support from your partner, family members, friends, or other new moms.  Try to stay positive in how you think. Think about the things you are grateful for.  Do not spend a lot of time alone.  Only take medicine as directed by your caregiver.  Keep all your postpartum appointments.  Let your caregiver know if you have any concerns. SEEK MEDICAL CARE IF: You are having a reaction or problems with your medicine. SEEK IMMEDIATE MEDICAL CARE IF:  You have suicidal feelings.  You feel you may harm the baby or someone else. Document Released: 09/26/2003 Document Revised: 03/16/2011 Document Reviewed: 10/03/2012 [facility] Patient Information 2014 [place], [place]. Cesarean Delivery Care After Refer to this sheet in the next few weeks. These instructions provide you with information on caring for yourself after your procedure. Your health care provider may also give you specific instructions. Your treatment has been planned according to current medical practices, but problems sometimes occur. Call your health care provider if you have any problems or questions after you go home. HOME CARE INSTRUCTIONS  Only take over-the-counter or prescription medications as directed by your health care provider.  Do not drink alcohol, especially if you are breastfeeding or taking medication to relieve pain.  Do not chew or smoke tobacco.  Continue to use good perineal care. Good perineal care includes:  Wiping your perineum from front to  back.  Keeping your perineum clean.  Check your surgical cut (incision) daily for increased redness, drainage, swelling, or separation of skin.  Clean your incision gently with soap and water every day, and then pat it dry. If your health care provider says it is OK, leave the incision uncovered. Use a bandage (dressing) if the incision is draining fluid or appears irritated. If the adhesive strips across the incision do not fall off within 7 days, carefully peel them off.  Hug a pillow when coughing or sneezing until your incision is healed. This helps to relieve pain.  Do not use tampons or douche until your health care provider says it is okay.  Shower, wash your hair, and take tub baths as directed by your health care provider.  Wear a well-fitting bra that provides breast support.  Limit wearing support panties or control-top hose.  Drink enough fluids to keep your urine clear or pale yellow.  Eat high-fiber foods such as whole grain cereals and breads, brown rice, beans, and fresh fruits and vegetables every day. These foods may help prevent or relieve constipation.  Resume activities such as climbing stairs, driving, lifting, exercising, or traveling as directed by your health care provider.  Talk to your health care provider about resuming sexual activities. This is dependent upon your risk of infection, your rate of healing, and your comfort and desire to resume sexual activity.  Try to have someone help you with your household activities and your newborn for at least a few days after  you leave the hospital.  Rest as much as possible. Try to rest or take a nap when your newborn is sleeping.  Increase your activities gradually.  Keep all of your scheduled postpartum appointments. It is very important to keep your scheduled follow-up appointments. At these appointments, your health care provider will be checking to make sure that you are healing physically and  emotionally. SEEK MEDICAL CARE IF:   You are passing large clots from your vagina. Save any clots to show your health care provider.  You have a foul smelling discharge from your vagina.  You have trouble urinating.  You are urinating frequently.  You have pain when you urinate.  You have a change in your bowel movements.  You have increasing redness, pain, or swelling near your incision.  You have pus draining from your incision.  Your incision is separating.  You have painful, hard, or reddened breasts.  You have a severe headache.  You have blurred vision or see spots.  You feel sad or depressed.  You have thoughts of hurting yourself or your newborn.  You have questions about your care, the care of your newborn, or medications.  You are dizzy or lightheaded.  You have a rash.  You have pain, redness, or swelling at the site of the removed intravenous access (IV) tube.  You have nausea or vomiting.  You stopped breastfeeding and have not had a menstrual period within 12 weeks of stopping.  You are not breastfeeding and have not had a menstrual period within 12 weeks of delivery.  You have a fever. SEEK IMMEDIATE MEDICAL CARE IF:  You have persistent pain.  You have chest pain.  You have shortness of breath.  You faint.  You have leg pain.  You have stomach pain.  Your vaginal bleeding saturates 2 or more sanitary pads in 1 hour. MAKE SURE YOU:   Understand these instructions.  Will watch your condition.  Will get help right away if you are not doing well or get worse. Document Released: 09/13/2001 Document Revised: 08/24/2012 Document Reviewed: 08/19/2011 [facility] Patient Information 2014 [place].

## 2013-02-09 ENCOUNTER — Ambulatory Visit: Payer: Self-pay

## 2013-02-09 NOTE — Lactation Note (Signed)
This note was copied from the chart of Nicole [name]. Lactation Consultation Note  Patient Name: Nicole Laban Emperormbrosia Thorley DGUYQ'IToday's Date: 02/09/2013 Reason for consult: Follow-up assessment Per mom breast feeding and bottle feeding. Mom presently bottle feeding  EBM . Double Photo tx D/C this am. F/U bilirubin this am. Per mom has a  Hand pump for D/C and does well with it.LC encouraged to call [facility]WIC for a  Appointment to be active. Per mom breast are full today . Reviewed sore nipple  and engorgement prevention and tx if needed. Mom aware of the [facility] and the  [facility] if needed.    Maternal Data    Feeding Feeding Type: Bottle Fed - Breast Milk Nipple Type: Slow - flow  LATCH Score/Interventions                Intervention(s): Breastfeeding basics reviewed     Lactation Tools Discussed/Used Tools: Pump (per mom breast are full , puped off 30 ml ) Breast pump type: Manual WIC Program:  (encouraged mom to call WIC for apt.)   Consult Status Consult Status: Complete    Kathrin Greathouseorio, Charleen Madera Ann 02/09/2013, 12:13 PM

## 2013-02-12 NOTE — Discharge Summary (Signed)
Cesarean Section Delivery Discharge Summary  Nicole [name] Linney  DOB:    03/23/1985 MRN:    [id number] CSN:    [id number]  Date of admission:                  02/05/13  Date of discharge:                   02/08/13  Procedures this admission:  [facility]  Date of Delivery: 02/06/13  Newborn Data:  Live born female  Birth Weight: 7 lb 14.5 oz (3585 g) APGAR: 7, 9  Currently remaining under phototherapy at time of mom's d/c.  History of Present Illness:  Ms. Nicole [name] Negro is a 28 y.o. female, G2P1001, who presents at 6729w1d weeks gestation. [facility]    Her pregnancy has been complicated by:  Patient Active Problem List   Diagnosis Date Noted  . Cesarean delivery delivered 02/06/2013  . Seizure--2013 x 1, no etiology 02/05/2013  . MRSA (methicillin resistant staph aureus) culture [facility] 02/05/2013  . Fetal pyelectasis--bilateral 02/05/2013  . Fetal cardiac echogenic focus--LV, resolved during pregnancy 02/05/2013  . Late prenatal care 02/05/2013  . GBS (group B Streptococcus carrier), +RV culture, currently pregnant 02/05/2013    Hospital course:  The patient was admitted for leaking of fluid and early labor.  She received epidural analgesia with benefit.  She began to have recurrent deep decels when she was 7 cm, despite interventions.  She was consented for C/S by Dr. Stefano GaulStringer.  She was taken to the OR for a primary LTCS under epidural anesthesia, without complications.  Her postpartum course was not complicated, although the baby did have jaundice and required phototherapy. Patient was discharged to home on postpartum day 2 doing well.  Feeding:  bottle  Contraception:  Undecided  Discharge hemoglobin:  Hemoglobin  Date Value Range Status  02/06/2013 11.1* 12.0 - 15.0 g/dL Final     HCT  Date Value Range Status  02/06/2013  32.9* 36.0 - 46.0 % Final    Discharge Physical Exam:   General: alert Lochia: appropriate Uterine Fundus: firm Incision: Honeycomb dressing CDI DVT Evaluation: No evidence of DVT seen on physical exam. Negative Homan's sign.  Intrapartum Procedures: cesarean: low cervical, transverse Postpartum Procedures: none Complications-Operative and Postpartum: Anemia without hemodynamic instability  Discharge Diagnoses: Term Pregnancy-delivered and Non-reassuring FHR  Discharge Information:  Activity:           per [facility] handout Diet:                routine Medications: Ibuprofen, Colace, Iron and Percocet Condition:      stable Instructions:  Care After Cesarean Delivery  Refer to this sheet in the next few weeks. These instructions provide you with information on caring for yourself after your procedure. Your caregiver may also give you specific instructions. Your treatment has been planned according to current medical practices, but problems sometimes occur. Call your caregiver if you have any problems or questions after you go home. HOME CARE INSTRUCTIONS  Only take over-the-counter or prescription medicines as directed by your caregiver.  Do not drink alcohol, especially if you are breastfeeding or taking medicine to relieve pain.  Do not chew or smoke tobacco.  Continue to use good perineal care. Good perineal care includes:  Wiping your perineum from front to back.  Keeping your perineum clean.  Check your cut (incision) daily for increased redness, drainage, swelling, or separation of skin.  Clean your incision gently with soap and water every day, and then pat it dry. If your caregiver says it is okay, leave the incision uncovered. Use a bandage (dressing) if the incision is draining fluid or appears irritated. If the adhesive strips across the incision do not fall off within 7 days, carefully peel them off.  Hug a pillow when coughing or sneezing until your incision is  healed. This helps to relieve pain.  Do not use tampons or douche until your caregiver says it is okay.  Shower, wash your hair, and take tub baths as directed by your caregiver.  Wear a well-fitting bra that provides breast support.  Limit wearing support panties or control-top hose.  Drink enough fluids to keep your urine clear or pale yellow.  Eat high-fiber foods such as whole grain cereals and breads, brown rice, beans, and fresh fruits and vegetables every day. These foods may help prevent or relieve constipation.  Resume activities such as climbing stairs, driving, lifting, exercising, or traveling as directed by your caregiver.  Talk to your caregiver about resuming sexual activities. This is dependent upon your risk of infection, your rate of healing, and your comfort and desire to resume sexual activity.  Try to have someone help you with your household activities and your newborn for at least a few days after you leave the hospital.  Rest as much as possible. Try to rest or take a nap when your newborn is sleeping.  Increase your activities gradually.  Keep all of your scheduled postpartum appointments. It is very important to keep your scheduled follow-up appointments. At these appointments, your caregiver will be checking to make sure that you are healing physically and emotionally. SEEK MEDICAL CARE IF:   You are passing large clots from your vagina. Save any clots to show your caregiver.  You have a foul smelling discharge from your vagina.  You have trouble urinating.  You are urinating frequently.  You have pain when you urinate.  You have a change in your bowel movements.  You have increasing redness, pain, or swelling near your incision.  You have pus draining from your incision.  Your incision is separating.  You have painful, hard, or reddened breasts.  You have a severe headache.  You have blurred vision or see spots.  You feel sad or  depressed.  You have thoughts of hurting yourself or your newborn.  You have questions about your care, the care of your newborn, or medicines.  You are dizzy or lightheaded.  You have a rash.  You have pain, redness, or swelling at the site of the removed intravenous access (IV) tube.  You have nausea or vomiting.  You stopped breastfeeding and have not had a menstrual period within 12 weeks of stopping.  You are not breastfeeding and have not had a menstrual period within 12 weeks of delivery.  You have a fever. SEEK IMMEDIATE MEDICAL CARE IF:  You have persistent pain.  You have chest pain.  You have shortness of breath.  You faint.  You have leg pain.  You have stomach pain.  Your vaginal bleeding saturates 2 or more sanitary pads in 1 hour. MAKE SURE YOU:   Understand these instructions.  Will watch your condition.  Will get help right away if you are not doing well or get worse. Document Released: 09/13/2001 Document Revised: 09/16/2011 Document Reviewed: 08/19/2011 ExitCare Patient  Information 2014 [place], [place].   Postpartum Depression and Baby Blues  The postpartum period begins right after the birth of a baby. During this time, there is often a great amount of joy and excitement. It is also a time of considerable changes in the life of the parent(s). Regardless of how many times a mother gives birth, each child brings new challenges and dynamics to the family. It is not unusual to have feelings of excitement accompanied by confusing shifts in moods, emotions, and thoughts. All mothers are at risk of developing postpartum depression or the "baby blues." These mood changes can occur right after giving birth, or they may occur many months after giving birth. The baby blues or postpartum depression can be mild or severe. Additionally, postpartum depression can resolve rather quickly, or it can be a long-term condition. CAUSES Elevated hormones and their rapid  decline are thought to be a main cause of postpartum depression and the baby blues. There are a number of hormones that radically change during and after pregnancy. Estrogen and progesterone usually decrease immediately after delivering your baby. The level of thyroid hormone and various cortisol steroids also rapidly drop. Other factors that play a major role in these changes include major life events and genetics.  RISK FACTORS If you have any of the following risks for the baby blues or postpartum depression, know what symptoms to watch out for during the postpartum period. Risk factors that may increase the likelihood of getting the baby blues or postpartum depression include:  Havinga personal or family history of depression.  Having depression while being pregnant.  Having premenstrual or oral contraceptive-associated mood issues.  Having exceptional life stress.  Having marital conflict.  Lacking a social support network.  Having a baby with special needs.  Having health problems such as diabetes. SYMPTOMS Baby blues symptoms include:  Brief fluctuations in mood, such as going from extreme happiness to sadness.  Decreased concentration.  Difficulty sleeping.  Crying spells, tearfulness.  Irritability.  Anxiety. Postpartum depression symptoms typically begin within the first month after giving birth. These symptoms include:  Difficulty sleeping or excessive sleepiness.  Marked weight loss.  Agitation.  Feelings of worthlessness.  Lack of interest in activity or food. Postpartum psychosis is a very concerning condition and can be dangerous. Fortunately, it is rare. Displaying any of the following symptoms is cause for immediate medical attention. Postpartum psychosis symptoms include:  Hallucinations and delusions.  Bizarre or disorganized behavior.  Confusion or disorientation. DIAGNOSIS  A diagnosis is made by an evaluation of your symptoms. There are no  medical or lab tests that lead to a diagnosis, but there are various questionnaires that a caregiver may use to identify those with the baby blues, postpartum depression, or psychosis. Often times, a screening tool called the [place] Postnatal Depression Scale is used to diagnose depression in the postpartum period.  TREATMENT The baby blues usually goes away on its own in 1 to 2 weeks. Social support is often all that is needed. You should be encouraged to get adequate sleep and rest. Occasionally, you may be given medicines to help you sleep.  Postpartum depression requires treatment as it can last several months or longer if it is not treated. Treatment may include individual or group therapy, medicine, or both to address any social, physiological, and psychological factors that may play a role in the depression. Regular exercise, a healthy diet, rest, and social support may also be strongly recommended.  Postpartum psychosis is more  serious and needs treatment right away. Hospitalization is often needed. HOME CARE INSTRUCTIONS  Get as much rest as you can. Nap when the baby sleeps.  Exercise regularly. Some women find yoga and walking to be beneficial.  Eat a balanced and nourishing diet.  Do little things that you enjoy. Have a cup of tea, take a bubble bath, read your favorite magazine, or listen to your favorite music.  Avoid alcohol.  Ask for help with household chores, cooking, grocery shopping, or running errands as needed. Do not try to do everything.  Talk to people close to you about how you are feeling. Get support from your partner, family members, friends, or other new moms.  Try to stay positive in how you think. Think about the things you are grateful for.  Do not spend a lot of time alone.  Only take medicine as directed by your caregiver.  Keep all your postpartum appointments.  Let your caregiver know if you have any concerns. SEEK MEDICAL CARE IF: You are having  a reaction or problems with your medicine. SEEK IMMEDIATE MEDICAL CARE IF:  You have suicidal feelings.  You feel you may harm the baby or someone else. Document Released: 09/26/2003 Document Revised: 03/16/2011 Document Reviewed: 10/28/2010 [facility] Patient Information 2014 [place], [place].  Discharge to: home  Follow-up Information   Follow up with [facility] In 6 weeks.   Contact information:   [address] [place] [address]        Nigel Bridgeman 02/12/2013

## 2013-11-06 ENCOUNTER — Encounter (HOSPITAL_COMMUNITY): Payer: Self-pay | Admitting: Obstetrics and Gynecology

## 2015-04-21 IMAGING — US US PELVIS LIMITED
1 series · 6 of 6 positions shown · non-contrast
Comparison: 06/19/2010

CLINICAL DATA: Evaluate for ureteral jets to document patency.

EXAM:
US PELVIS LIMITED
TECHNIQUE: Ultrasound examination of the pelvic soft tissues was performed in
the area of clinical concern.

[Series 1: us pelvis limited · 6 of 6 slices shown]
[im 1/6]
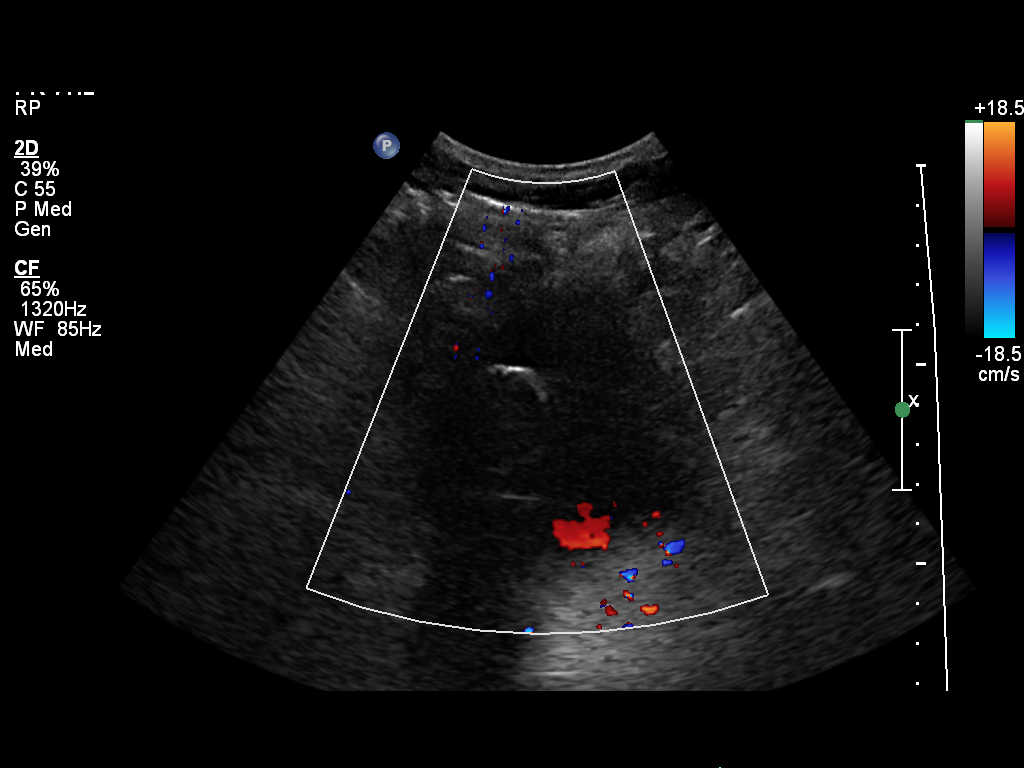
[im 2/6]
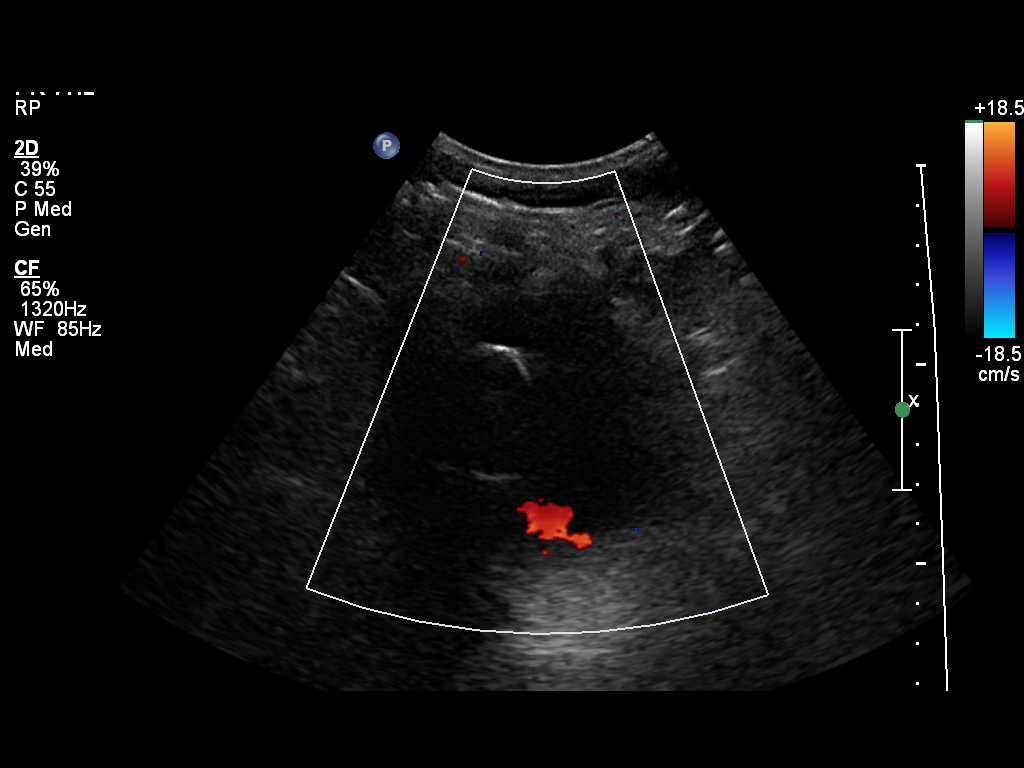
[im 3/6]
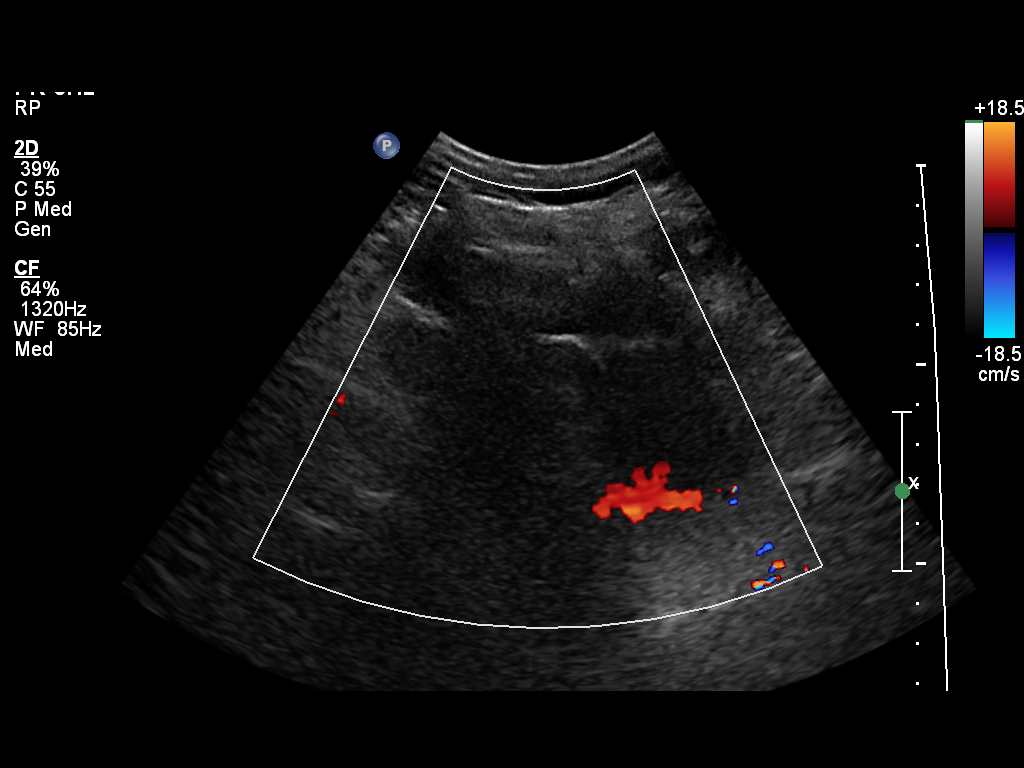
[im 4/6]
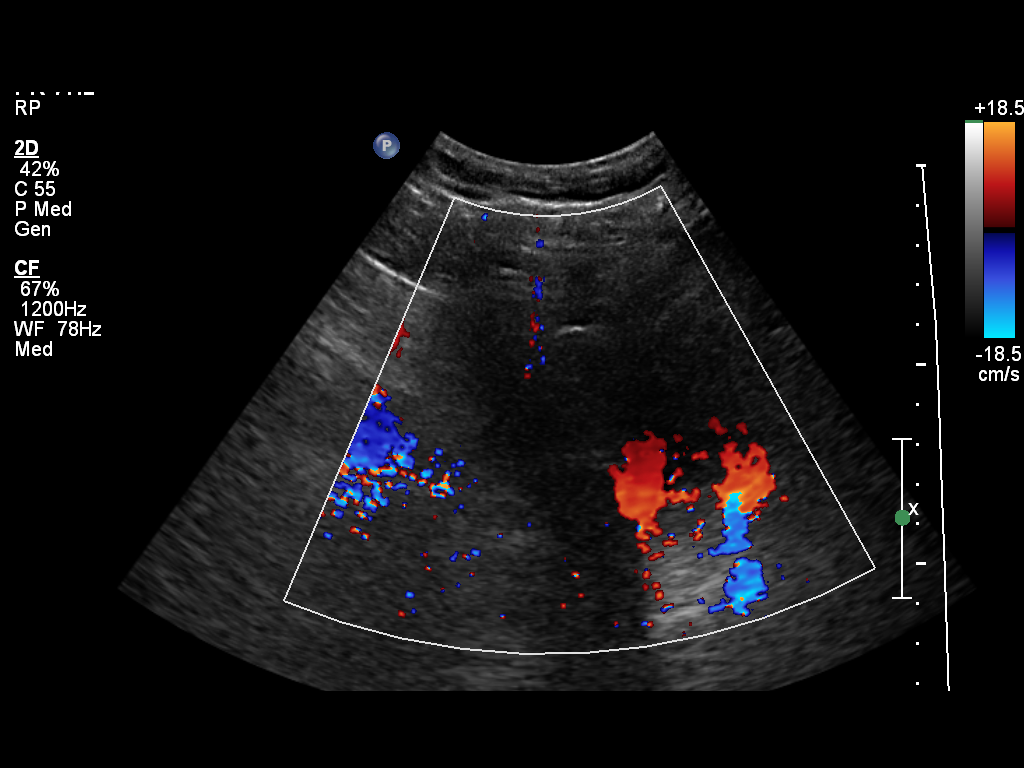
[im 5/6]
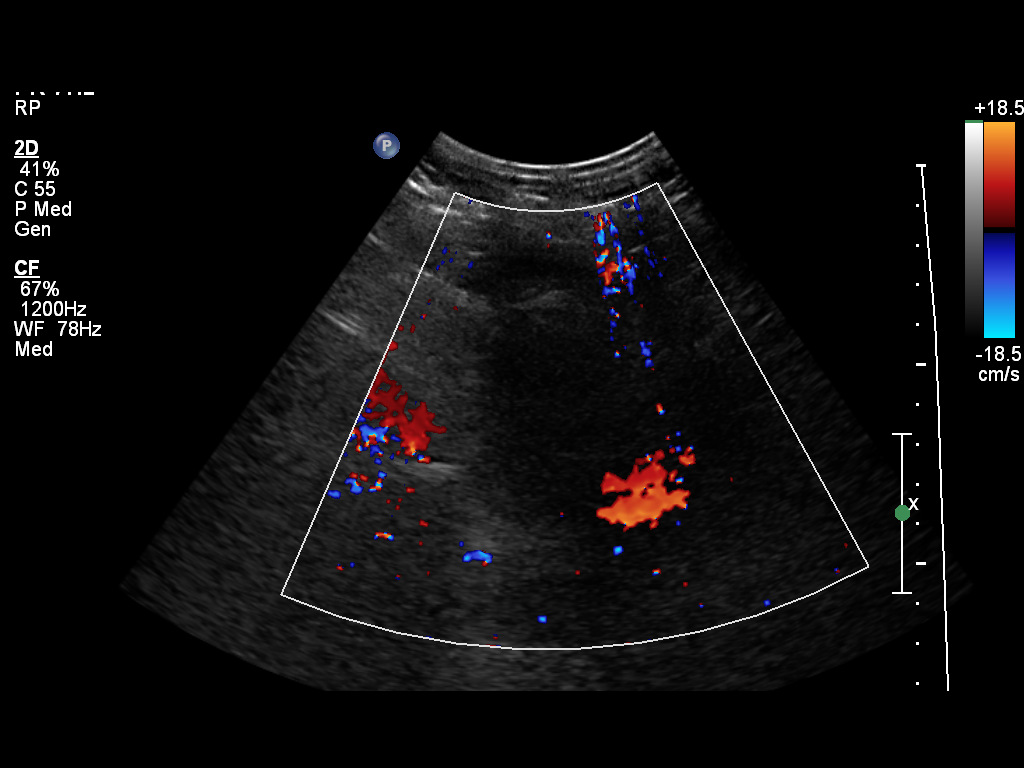
[im 6/6]
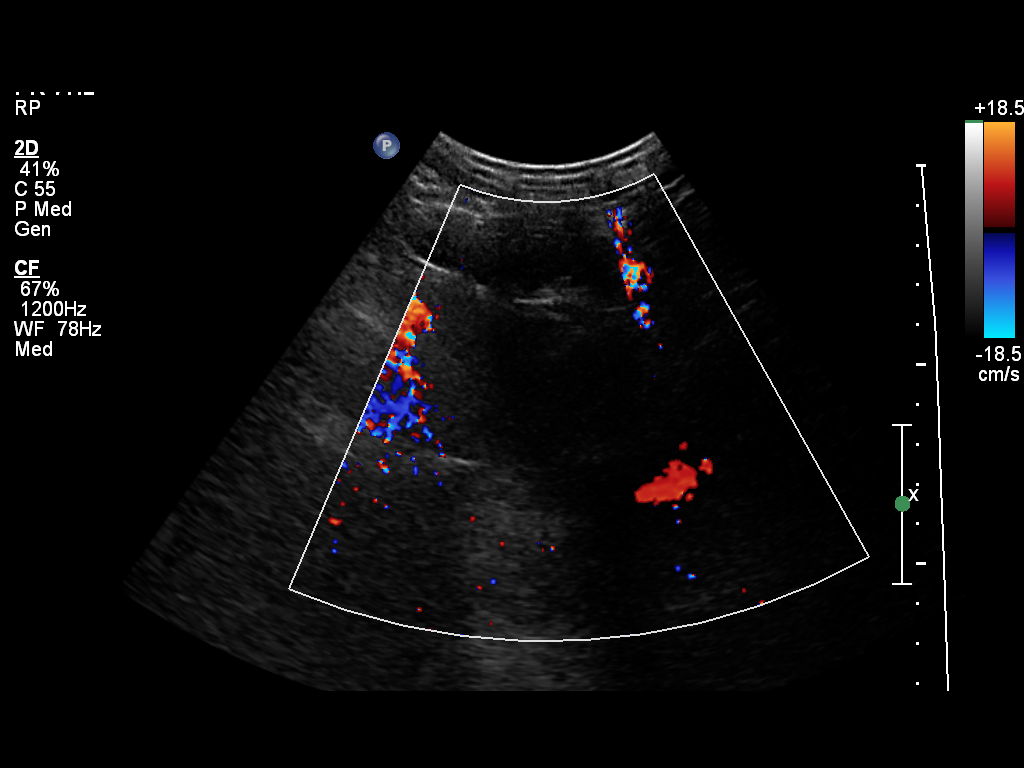

[6 of 6 positions shown; findings below may reference images not displayed]

FINDINGS: Both ureteral jets are visualized and appear normal.
IMPRESSION: 1. Normal exam.  Bilateral ureteral jets are present.

## 2019-06-05 ENCOUNTER — Other Ambulatory Visit: Payer: Self-pay

## 2019-06-05 ENCOUNTER — Ambulatory Visit (HOSPITAL_COMMUNITY)
Admission: EM | Admit: 2019-06-05 | Discharge: 2019-06-05 | Disposition: A | Discharge status: Home / Self Care | Payer: Medicaid Other

## 2019-06-05 NOTE — ED Notes (Signed)
Patient left without speaking to clinical staff.
